# Patient Record
Sex: Female | Born: 1950 | Race: White | Hispanic: No | Marital: Married | Smoking: Never smoker
Health system: Southern US, Community
[De-identification: ages and names within clinical notes are randomized; demographics above are authoritative.]

## PROBLEM LIST (undated history)

## (undated) DIAGNOSIS — E039 Hypothyroidism, unspecified: Secondary | ICD-10-CM

## (undated) DIAGNOSIS — I10 Essential (primary) hypertension: Secondary | ICD-10-CM

## (undated) DIAGNOSIS — M810 Age-related osteoporosis without current pathological fracture: Secondary | ICD-10-CM

---

## 2005-05-30 ENCOUNTER — Other Ambulatory Visit: Admission: RE | Admit: 2005-05-30 | Discharge: 2005-05-30 | Payer: Self-pay | Admitting: Gynecology

## 2006-06-05 ENCOUNTER — Ambulatory Visit (HOSPITAL_COMMUNITY): Admission: RE | Admit: 2006-06-05 | Discharge: 2006-06-05 | Payer: Self-pay | Admitting: General Surgery

## 2006-06-13 ENCOUNTER — Other Ambulatory Visit: Admission: RE | Admit: 2006-06-13 | Discharge: 2006-06-13 | Payer: Self-pay | Admitting: Gynecology

## 2007-08-23 ENCOUNTER — Ambulatory Visit (HOSPITAL_COMMUNITY): Admission: RE | Admit: 2007-08-23 | Discharge: 2007-08-23 | Payer: Self-pay | Admitting: Internal Medicine

## 2007-09-06 ENCOUNTER — Ambulatory Visit: Payer: Self-pay | Admitting: Orthopedic Surgery

## 2007-09-06 DIAGNOSIS — S62319A Displaced fracture of base of unspecified metacarpal bone, initial encounter for closed fracture: Secondary | ICD-10-CM | POA: Insufficient documentation

## 2007-09-29 ENCOUNTER — Encounter: Payer: Self-pay | Admitting: Orthopedic Surgery

## 2010-10-01 NOTE — H&P (Signed)
Rebecca Gilbert, Rebecca Gilbert                ACCOUNT NO.:  [id number]   MEDICAL RECORD NO.:  [id number]          PATIENT TYPE:  AMB   LOCATION:                                FACILITY:  [facility]   PHYSICIAN:  Dalia Heading, M.D.  DATE OF BIRTH:  [birth year]   DATE OF ADMISSION:  06/05/2006  DATE OF DISCHARGE:  LH                              HISTORY & PHYSICAL   CHIEF COMPLAINT:  Need for screening colonoscopy.   HISTORY OF PRESENT ILLNESS:  The patient is a 60 year old white female  who is referred for endoscopic evaluation.  she needs a colonoscopy for  screening purposes.  No abdominal pain, weight loss, nausea, vomiting,  diarrhea, constipation, melena, or hematochezia have been noted.  She  has never had a colonoscopy.  There is no family history of colon  carcinoma.   PAST MEDICAL HISTORY:  Remarkable for hypothyroidism.   PAST SURGICAL HISTORY:  Unremarkable.   CURRENT MEDICATIONS:  Synthroid, Vivelle-Dot, Prometrium.   ALLERGIES:  NO KNOWN DRUG ALLERGIES.   REVIEW OF SYSTEMS:  Noncontributory.   PHYSICAL EXAMINATION:  GENERAL:  The patient is a well-developed, well-  nourished white female in no acute distress.  LUNGS:  Clear to auscultation with equal breath sounds bilaterally.  HEART:  Examination reveals regular rate and rhythm without S3, S4, or  murmurs.  ABDOMEN:  Soft, nontender, and nondistended.  No hepatosplenomegaly or  masses are noted.  RECTAL:  Examination was deferred to the procedure.   IMPRESSION:  Need for screening colonoscopy.   PLAN:  The patient is scheduled for colonoscopy on June 05, 2006.  The risks and benefits of the procedure including bleeding and  perforation were fully explained to the patient, who gave informed  consent.      Dalia Heading, M.D.  Electronically Signed     MAJ/MEDQ  D:  05/04/2006  T:  05/05/2006  Job:  [id number]   cc:   Patrica Duel, M.D.  Fax: [phone]

## 2019-06-27 ENCOUNTER — Other Ambulatory Visit (HOSPITAL_COMMUNITY): Payer: Self-pay | Admitting: Family Medicine

## 2019-06-27 DIAGNOSIS — M810 Age-related osteoporosis without current pathological fracture: Secondary | ICD-10-CM

## 2019-06-27 DIAGNOSIS — Z1231 Encounter for screening mammogram for malignant neoplasm of breast: Secondary | ICD-10-CM

## 2019-07-25 ENCOUNTER — Other Ambulatory Visit: Payer: Self-pay

## 2019-07-25 ENCOUNTER — Ambulatory Visit (HOSPITAL_COMMUNITY)
Admission: RE | Admit: 2019-07-25 | Discharge: 2019-07-25 | Disposition: A | Discharge status: Home / Self Care | Payer: Medicare PPO | Source: Ambulatory Visit | Attending: Family Medicine | Admitting: Family Medicine

## 2019-07-25 DIAGNOSIS — M810 Age-related osteoporosis without current pathological fracture: Secondary | ICD-10-CM | POA: Insufficient documentation

## 2019-07-25 DIAGNOSIS — Z1231 Encounter for screening mammogram for malignant neoplasm of breast: Secondary | ICD-10-CM | POA: Insufficient documentation

## 2019-08-21 ENCOUNTER — Ambulatory Visit (HOSPITAL_COMMUNITY)
Admission: RE | Admit: 2019-08-21 | Discharge: 2019-08-21 | Disposition: A | Discharge status: Home / Self Care | Payer: Medicare PPO | Source: Ambulatory Visit | Attending: Family Medicine | Admitting: Family Medicine

## 2019-08-21 ENCOUNTER — Other Ambulatory Visit: Payer: Self-pay

## 2019-08-21 ENCOUNTER — Ambulatory Visit (HOSPITAL_COMMUNITY): Payer: Medicare PPO

## 2019-08-21 ENCOUNTER — Encounter (HOSPITAL_COMMUNITY): Payer: Self-pay

## 2019-08-21 DIAGNOSIS — Z1231 Encounter for screening mammogram for malignant neoplasm of breast: Secondary | ICD-10-CM | POA: Insufficient documentation

## 2019-08-26 ENCOUNTER — Other Ambulatory Visit (HOSPITAL_COMMUNITY): Payer: Self-pay | Admitting: Family Medicine

## 2019-08-26 ENCOUNTER — Inpatient Hospital Stay
Admission: RE | Admit: 2019-08-26 | Discharge: 2019-08-26 | Disposition: A | Discharge status: Home / Self Care | Payer: Self-pay | Source: Ambulatory Visit | Attending: Family Medicine | Admitting: Family Medicine

## 2019-08-26 DIAGNOSIS — Z1231 Encounter for screening mammogram for malignant neoplasm of breast: Secondary | ICD-10-CM

## 2020-08-04 ENCOUNTER — Other Ambulatory Visit (HOSPITAL_COMMUNITY): Payer: Self-pay | Admitting: Family Medicine

## 2020-08-04 DIAGNOSIS — Z1231 Encounter for screening mammogram for malignant neoplasm of breast: Secondary | ICD-10-CM

## 2020-08-26 ENCOUNTER — Ambulatory Visit (HOSPITAL_COMMUNITY)
Admission: RE | Admit: 2020-08-26 | Discharge: 2020-08-26 | Disposition: A | Discharge status: Home / Self Care | Payer: Medicare PPO | Source: Ambulatory Visit | Attending: Family Medicine | Admitting: Family Medicine

## 2020-08-26 DIAGNOSIS — Z1231 Encounter for screening mammogram for malignant neoplasm of breast: Secondary | ICD-10-CM | POA: Diagnosis present

## 2020-08-27 NOTE — Patient Instructions (Signed)
Rebecca Gilbert [facility]  08/27/2020     @PREFPERIOPPHARMACY @   Your procedure is scheduled on  09/07/2020.   Report to 09/09/2020 at  0700  A.M.   Call this number if you have problems the morning of surgery:  [phone]   Remember:  Do not eat or drink after midnight.                       Take these medicines the morning of surgery with A SIP OF WATER  Levothyroxine.      Please brush your teeth.  Do not wear jewelry, make-up or nail polish.  Do not wear lotions, powders, or perfumes, or deodorant.  Do not shave 48 hours prior to surgery.  Men may shave face and neck.  Do not bring valuables to the hospital.  [facility] is not responsible for any belongings or valuables.  Contacts, dentures or bridgework may not be worn into surgery.  Leave your suitcase in the car.  After surgery it may be brought to your room.  For patients admitted to the hospital, discharge time will be determined by your treatment team.  Patients discharged the day of surgery will not be allowed to drive home and must have someone with them for 24 hours.    Special instructions:  DO NOT smoke tobacco or vape for 24 hours before your surgery.   Please read over the following fact sheets that you were given. Anesthesia Post-op Instructions and Care and Recovery After Surgery      Cataract Surgery, Care After This sheet gives you information about how to care for yourself after your procedure. Your health care provider may also give you more specific instructions. If you have problems or questions, contact your health care provider. What can I expect after the procedure? After the procedure, it is common to have:  Itching.  Discomfort.  Fluid discharge.  Sensitivity to light and to touch.  Bruising in or around the eye.  Mild blurred vision. Follow these instructions at home: Eye care  Do not touch or rub your eyes.  Protect your eyes as told by your health care provider. You  may be told to wear a protective eye shield or sunglasses.  Do not put a contact lens into the affected eye or eyes until your health care provider approves.  Keep the area around your eye clean and dry: ? Avoid swimming. ? Do not allow water to hit you directly in the face while showering. ? Keep soap and shampoo out of your eyes.  Check your eye every day for signs of infection. Watch for: ? Redness, swelling, or pain. ? Fluid, blood, or pus. ? Warmth. ? A bad smell. ? Vision that is getting worse. ? Sensitivity that is getting worse.   Activity  Do not drive for 24 hours if you were given a sedative during your procedure.  Avoid strenuous activities, such as playing contact sports, for as long as told by your health care provider.  Do not drive or use heavy machinery until your health care provider approves.  Do not bend or lift heavy objects. Bending increases pressure in the eye. You can walk, climb stairs, and do light household chores.  Ask your health care provider when you can return to work. If you work in a dusty environment, you may be advised to wear protective eyewear for a period of time. General instructions  Take or  apply over-the-counter and prescription medicines only as told by your health care provider. This includes eye drops.  Keep all follow-up visits as told by your health care provider. This is important. Contact a health care provider if:  You have increased bruising around your eye.  You have pain that is not helped with medicine.  You have a fever.  You have redness, swelling, or pain in your eye.  You have fluid, blood, or pus coming from your incision.  Your vision gets worse.  Your sensitivity to light gets worse. Get help right away if:  You have sudden loss of vision.  You see flashes of light or spots (floaters).  You have severe eye pain.  You develop nausea or vomiting. Summary  After your procedure, it is common to have  itching, discomfort, bruising, fluid discharge, or sensitivity to light.  Follow instructions from your health care provider about caring for your eye after the procedure.  Do not rub your eye after the procedure. You may need to wear eye protection or sunglasses. Do not wear contact lenses. Keep the area around your eye clean and dry.  Avoid activities that require a lot of effort. These include playing sports and lifting heavy objects.  Contact a health care provider if you have increased bruising, pain that does not go away, or a fever. Get help right away if you suddenly lose your vision, see flashes of light or spots, or have severe pain in the eye. This information is not intended to replace advice given to you by your health care provider. Make sure you discuss any questions you have with your health care provider. Document Revised: 02/26/2019 Document Reviewed: 10/30/2017 Elsevier Patient Education  2021 [employer].

## 2020-09-01 NOTE — H&P (Signed)
Surgical History & Physical  Patient Name: Rebecca Gilbert DOB: Feb 08, 1951  Surgery: Cataract extraction with intraocular lens implant phacoemulsification; Right Eye  Surgeon: [name] MD Surgery Date:  09/07/2020 Pre-Op Date:  09/01/2020  HPI: A 48 Yr. old female patient is referred by Dr Rebecca Gilbert for cataract eval. 1. 1. The patient complains of nighttime light - car headlights, street lamps etc. glare causing poor vision, which began 6 months ago. Both eyes are affected. The episode is gradual. The condition's severity increased since last visit. Symptoms occur when the patient is driving and outside. This is negatively affecting the patient's quality of life. HPI Completed by Dr. [name]  Medical History: Cataracts Strabismus, Reduced BVA, Arthritis High Blood Pressure Thyroid Problems  Review of Systems Negative Allergic/Immunologic Negative Cardiovascular Negative Constitutional Negative Ear, Nose, Mouth & Throat Negative Endocrine Negative Eyes Negative Gastrointestinal Negative Genitourinary Negative Hemotologic/Lymphatic Negative Integumentary Negative Musculoskeletal Negative Neurological Negative Psychiatry Negative Respiratory  Social   Never smoked   Medication Levothyroxine, Losartan, Estradiol, Vaginal cream,   Sx/Procedures  None  Drug Allergies   NKDA  History & Physical: Heent:  Cataract, Right eye NECK: supple without bruits LUNGS: lungs clear to auscultation CV: regular rate and rhythm Abdomen: soft and non-tender  Impression & Plan: Assessment: 1.  NUCLEAR SCLEROSIS AGE RELATED; Both Eyes (H25.13) 2.  BLEPHARITIS; Right Lower Lid, Left Upper Lid, Left Lower Lid, Right Upper Lid (H01.001, H01.002,H01.004,H01.005) 3.  CONJUNCTIVOCHALASIS; Both Eyes (H11.823) 4.  DERMATOCHALASIS; Right Upper Lid, Left Upper Lid (H02.831, H02.834) 5.  ASTIGMATISM, REGULAR; Both Eyes (H52.223) 6.  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  Cataract accounts for the patient's decreased vision. This visual impairment is not correctable with a tolerable change in glasses or contact lenses. Cataract surgery with an implantation of a new lens should significantly improve the visual and functional status of the patient. Discussed all risks, benefits, alternatives, and potential complications. Discussed the procedures and recovery. Patient desires to have surgery. A-scan ordered and performed today for intra-ocular lens calculations. The surgery will be performed in order to improve vision for driving, reading, and for eye examinations. Recommend phacoemulsification with intra-ocular lens. Recommend Dextenza for post-operative pain and inflammation. Right Eye worse - first. Dilates well - shugarcaine by protocol. 2.  regular lid cleaning. 3.  Asymptomatic. 4.  May be symptomatic. 5.  Declines toric lens. 6.  Symptomatic. Discussed monocular cataract surgery - risks outweigh benefits.

## 2020-09-02 ENCOUNTER — Encounter (HOSPITAL_COMMUNITY)
Admission: RE | Admit: 2020-09-02 | Discharge: 2020-09-02 | Disposition: A | Discharge status: Home / Self Care | Payer: Medicare PPO | Source: Ambulatory Visit | Attending: Ophthalmology | Admitting: Ophthalmology

## 2020-09-02 ENCOUNTER — Encounter (HOSPITAL_COMMUNITY): Payer: Self-pay

## 2020-09-02 ENCOUNTER — Other Ambulatory Visit: Payer: Self-pay

## 2020-09-02 HISTORY — DX: Age-related osteoporosis without current pathological fracture: M81.0

## 2020-09-02 HISTORY — DX: Essential (primary) hypertension: I10

## 2020-09-02 HISTORY — DX: Hypothyroidism, unspecified: E03.9

## 2020-09-04 ENCOUNTER — Other Ambulatory Visit (HOSPITAL_COMMUNITY)
Admission: RE | Admit: 2020-09-04 | Discharge: 2020-09-04 | Disposition: A | Discharge status: Home / Self Care | Payer: Medicare PPO | Source: Ambulatory Visit | Attending: Ophthalmology | Admitting: Ophthalmology

## 2020-09-04 ENCOUNTER — Other Ambulatory Visit: Payer: Self-pay

## 2020-09-04 DIAGNOSIS — Z01812 Encounter for preprocedural laboratory examination: Secondary | ICD-10-CM | POA: Diagnosis present

## 2020-09-04 DIAGNOSIS — Z20822 Contact with and (suspected) exposure to covid-19: Secondary | ICD-10-CM | POA: Diagnosis not present

## 2020-09-05 LAB — SARS CORONAVIRUS 2 (TAT 6-24 HRS): SARS Coronavirus 2: NEGATIVE

## 2020-09-07 ENCOUNTER — Ambulatory Visit (HOSPITAL_COMMUNITY): Payer: Medicare PPO | Admitting: Anesthesiology

## 2020-09-07 ENCOUNTER — Encounter (HOSPITAL_COMMUNITY): Payer: Self-pay | Admitting: Ophthalmology

## 2020-09-07 ENCOUNTER — Encounter (HOSPITAL_COMMUNITY)
Admission: RE | Disposition: A | Discharge status: Home / Self Care | Payer: Self-pay | Source: Home / Self Care | Attending: Ophthalmology

## 2020-09-07 ENCOUNTER — Ambulatory Visit (HOSPITAL_COMMUNITY)
Admission: RE | Admit: 2020-09-07 | Discharge: 2020-09-07 | Disposition: A | Discharge status: Home / Self Care | Payer: Medicare PPO | Attending: Ophthalmology | Admitting: Ophthalmology

## 2020-09-07 DIAGNOSIS — H52223 Regular astigmatism, bilateral: Secondary | ICD-10-CM | POA: Insufficient documentation

## 2020-09-07 DIAGNOSIS — H0100B Unspecified blepharitis left eye, upper and lower eyelids: Secondary | ICD-10-CM | POA: Diagnosis not present

## 2020-09-07 DIAGNOSIS — H2511 Age-related nuclear cataract, right eye: Secondary | ICD-10-CM | POA: Insufficient documentation

## 2020-09-07 DIAGNOSIS — H11823 Conjunctivochalasis, bilateral: Secondary | ICD-10-CM | POA: Insufficient documentation

## 2020-09-07 DIAGNOSIS — H0100A Unspecified blepharitis right eye, upper and lower eyelids: Secondary | ICD-10-CM | POA: Diagnosis not present

## 2020-09-07 DIAGNOSIS — H50012 Monocular esotropia, left eye: Secondary | ICD-10-CM | POA: Insufficient documentation

## 2020-09-07 DIAGNOSIS — H02831 Dermatochalasis of right upper eyelid: Secondary | ICD-10-CM | POA: Insufficient documentation

## 2020-09-07 DIAGNOSIS — H02834 Dermatochalasis of left upper eyelid: Secondary | ICD-10-CM | POA: Diagnosis not present

## 2020-09-07 HISTORY — PX: CATARACT EXTRACTION W/PHACO: SHX586

## 2020-09-07 SURGERY — PHACOEMULSIFICATION, CATARACT, WITH IOL INSERTION
Anesthesia: Monitor Anesthesia Care | Site: Eye | Laterality: Right

## 2020-09-07 MED ORDER — LIDOCAINE HCL 3.5 % OP GEL
1.0000 "application " | Freq: Once | OPHTHALMIC | Status: AC
  Administered 2020-09-07: 1 via OPHTHALMIC

## 2020-09-07 MED ORDER — EPINEPHRINE PF 1 MG/ML IJ SOLN
INTRAMUSCULAR | Status: AC
  Filled 2020-09-07: qty 2

## 2020-09-07 MED ORDER — TETRACAINE HCL 0.5 % OP SOLN
1.0000 [drp] | OPHTHALMIC | Status: AC | PRN
  Administered 2020-09-07 (×3): 1 [drp] via OPHTHALMIC

## 2020-09-07 MED ORDER — PHENYLEPHRINE HCL 2.5 % OP SOLN
1.0000 [drp] | OPHTHALMIC | Status: AC | PRN
  Administered 2020-09-07 (×3): 1 [drp] via OPHTHALMIC

## 2020-09-07 MED ORDER — SODIUM HYALURONATE 23MG/ML IO SOSY
PREFILLED_SYRINGE | INTRAOCULAR | Status: DC | PRN
  Administered 2020-09-07: 0.6 mL via INTRAOCULAR

## 2020-09-07 MED ORDER — TROPICAMIDE 1 % OP SOLN
1.0000 [drp] | OPHTHALMIC | Status: AC
  Administered 2020-09-07 (×3): 1 [drp] via OPHTHALMIC

## 2020-09-07 MED ORDER — STERILE WATER FOR IRRIGATION IR SOLN
Status: DC | PRN
  Administered 2020-09-07: 250 mL

## 2020-09-07 MED ORDER — POVIDONE-IODINE 5 % OP SOLN
OPHTHALMIC | Status: DC | PRN
  Administered 2020-09-07: 1 via OPHTHALMIC

## 2020-09-07 MED ORDER — LIDOCAINE HCL (PF) 1 % IJ SOLN
INTRAOCULAR | Status: DC | PRN
  Administered 2020-09-07: 1 mL via OPHTHALMIC

## 2020-09-07 MED ORDER — NEOMYCIN-POLYMYXIN-DEXAMETH 3.5-10000-0.1 OP SUSP
OPHTHALMIC | Status: DC | PRN
  Administered 2020-09-07: 1 [drp] via OPHTHALMIC

## 2020-09-07 MED ORDER — EPINEPHRINE PF 1 MG/ML IJ SOLN
INTRAOCULAR | Status: DC | PRN
  Administered 2020-09-07: 500 mL

## 2020-09-07 MED ORDER — SODIUM HYALURONATE 10 MG/ML IO SOLUTION
PREFILLED_SYRINGE | INTRAOCULAR | Status: DC | PRN
  Administered 2020-09-07: 0.85 mL via INTRAOCULAR

## 2020-09-07 MED ORDER — BSS IO SOLN
INTRAOCULAR | Status: DC | PRN
  Administered 2020-09-07: 15 mL

## 2020-09-07 SURGICAL SUPPLY — 11 items
CLOTH BEACON ORANGE TIMEOUT ST (SAFETY) ×2 IMPLANT
EYE SHIELD UNIVERSAL CLEAR (GAUZE/BANDAGES/DRESSINGS) ×2 IMPLANT
GLOVE SURG UNDER POLY LF SZ7 (GLOVE) ×4 IMPLANT
NEEDLE HYPO 18GX1.5 BLUNT FILL (NEEDLE) ×2 IMPLANT
PAD ARMBOARD 7.5X6 YLW CONV (MISCELLANEOUS) ×2 IMPLANT
SYR TB 1ML LL NO SAFETY (SYRINGE) ×2 IMPLANT
TAPE SURG TRANSPORE 1 IN (GAUZE/BANDAGES/DRESSINGS) ×1 IMPLANT
TAPE SURGICAL TRANSPORE 1 IN (GAUZE/BANDAGES/DRESSINGS) ×2
TECNIS 1 PIECE IOL (Intraocular Lens) ×2 IMPLANT
VISCOELASTIC ADDITIONAL (OPHTHALMIC RELATED) IMPLANT
WATER STERILE IRR 250ML POUR (IV SOLUTION) ×2 IMPLANT

## 2020-09-07 NOTE — Anesthesia Preprocedure Evaluation (Signed)
Anesthesia Evaluation  Patient identified by MRN, date of birth, ID band Patient awake    Reviewed: Allergy & Precautions, NPO status , Patient's Chart, lab work & pertinent test results  Airway Mallampati: II  TM Distance: >3 FB Neck ROM: Full    Dental  (+) Dental Advisory Given, Teeth Intact   Pulmonary neg pulmonary ROS,    Pulmonary exam normal breath sounds clear to auscultation       Cardiovascular Exercise Tolerance: Good hypertension, Pt. on medications Normal cardiovascular exam Rhythm:Regular Rate:Normal     Neuro/Psych negative neurological ROS  negative psych ROS   GI/Hepatic negative GI ROS, Neg liver ROS,   Endo/Other  Hypothyroidism   Renal/GU negative Renal ROS  negative genitourinary   Musculoskeletal Osteoporosis     Abdominal   Peds negative pediatric ROS (+)  Hematology negative hematology ROS (+)   Anesthesia Other Findings   Reproductive/Obstetrics negative OB ROS                             Anesthesia Physical  Anesthesia Plan  ASA: II  Anesthesia Plan: MAC   Post-op Pain Management:    Induction:   PONV Risk Score and Plan: Treatment may vary due to age or medical condition  Airway Management Planned: Nasal Cannula and Natural Airway  Additional Equipment:   Intra-op Plan:   Post-operative Plan:   Informed Consent: I have reviewed the patients History and Physical, chart, labs and discussed the procedure including the risks, benefits and alternatives for the proposed anesthesia with the patient or authorized representative who has indicated his/her understanding and acceptance.     Dental advisory given  Plan Discussed with: CRNA and Surgeon  Anesthesia Plan Comments:         Anesthesia Quick Evaluation  

## 2020-09-07 NOTE — Op Note (Signed)
Date of procedure: 09/07/20  Pre-operative diagnosis: Visually significant age-related nuclear cataract, Right Eye (H25.11)  Post-operative diagnosis: Visually significant age-related nuclear cataract, Right Eye  Procedure: Removal of cataract via phacoemulsification and insertion of intra-ocular lens Wynetta Emery and [employer] DCB00  +20.0D into the capsular bag of the Right Eye  Attending surgeon: Gerda Diss. Prim Morace, MD, MA  Anesthesia: MAC, Topical Akten  Complications: None  Estimated Blood Loss: <38m (minimal)  Specimens: None  Implants: As above  Indications:  Visually significant age-related cataract, Right Eye  Procedure:  The patient was seen and identified in the pre-operative area. The operative eye was identified and dilated.  The operative eye was marked.  Topical anesthesia was administered to the operative eye.     The patient was then to the operative suite and placed in the supine position.  A timeout was performed confirming the patient, procedure to be performed, and all other relevant information.   The patient's face was prepped and draped in the usual fashion for intra-ocular surgery.  A lid speculum was placed into the operative eye and the surgical microscope moved into place and focused.  A superotemporal paracentesis was created using a 20 gauge paracentesis blade.  Shugarcaine was injected into the anterior chamber.  Viscoelastic was injected into the anterior chamber.  A temporal clear-corneal main wound incision was created using a 2.449mmicrokeratome.  A continuous curvilinear capsulorrhexis was initiated using an irrigating cystitome and completed using capsulorrhexis forceps.  Hydrodissection and hydrodeliniation were performed.  Viscoelastic was injected into the anterior chamber.  A phacoemulsification handpiece and a chopper as a second instrument were used to remove the nucleus and epinucleus. The irrigation/aspiration handpiece was used to remove any  remaining cortical material.   The capsular bag was reinflated with viscoelastic, checked, and found to be intact.  The intraocular lens was inserted into the capsular bag.  The irrigation/aspiration handpiece was used to remove any remaining viscoelastic.  The clear corneal wound and paracentesis wounds were then hydrated and checked with Weck-Cels to be watertight.  The lid-speculum and drape was removed, and the patient's face was cleaned with a wet and dry 4x4.  Maxitrol was instilled in the eye before a clear shield was taped over the eye. The patient was taken to the post-operative care unit in good condition, having tolerated the procedure well.  Post-Op Instructions: [facility] and will receive all other orders and instructions.

## 2020-09-07 NOTE — Interval H&P Note (Signed)
History and Physical Interval Note:  09/07/2020 8:11 AM  Rebecca Gilbert  has presented today for surgery, with the diagnosis of Nuclear sclerotic cataract - Right eye.  The various methods of treatment have been discussed with the patient and family. After consideration of risks, benefits and other options for treatment, the patient has consented to  Procedure(s) with comments: CATARACT EXTRACTION PHACO AND INTRAOCULAR LENS PLACEMENT RIGHT EYE (Right) - right as a surgical intervention.  The patient's history has been reviewed, patient examined, no change in status, stable for surgery.  I have reviewed the patient's chart and labs.  Questions were answered to the patient's satisfaction.     [name]

## 2020-09-07 NOTE — Discharge Instructions (Addendum)
Please discharge patient when stable, will follow up today with Dr. Wrzosek at the [facility] office immediately following discharge.  Leave shield in place until visit.  All paperwork with discharge instructions will be given at the office.   [facility] Address:  [address]  [place], [place] [address]  

## 2020-09-07 NOTE — Transfer of Care (Signed)
Immediate Anesthesia Transfer of Care Note  Patient: Rebecca Gilbert [facility]  Procedure(s) Performed: CATARACT EXTRACTION PHACO AND INTRAOCULAR LENS PLACEMENT RIGHT EYE (Right Eye)  Patient Location: Short Stay  Anesthesia Type:MAC  Level of Consciousness: awake  Airway & Oxygen Therapy: Patient Spontanous Breathing  Post-op Assessment: Report given to RN  Post vital signs: Reviewed and stable  Last Vitals:  Vitals Value Taken Time  BP    Temp    Pulse    Resp    SpO2      Last Pain:  Vitals:   09/07/20 0705  TempSrc: Oral  PainSc: 0-No pain      Patients Stated Pain Goal: 6 (56/81/27 5170)  Complications: No complications documented.

## 2020-09-07 NOTE — Anesthesia Postprocedure Evaluation (Signed)
Anesthesia Post Note  Patient: Rebecca Gilbert [facility]  Procedure(s) Performed: CATARACT EXTRACTION PHACO AND INTRAOCULAR LENS PLACEMENT RIGHT EYE (Right Eye)  Patient location during evaluation: Short Stay Anesthesia Type: MAC Level of consciousness: awake and alert Pain management: pain level controlled Vital Signs Assessment: post-procedure vital signs reviewed and stable Respiratory status: spontaneous breathing Cardiovascular status: blood pressure returned to baseline and stable Postop Assessment: no apparent nausea or vomiting Anesthetic complications: no   No complications documented.   Last Vitals:  Vitals:   09/07/20 0705  BP: 125/75  Pulse: 74  Resp: 18  Temp: 36.7 C  SpO2: 100%    Last Pain:  Vitals:   09/07/20 0705  TempSrc: Oral  PainSc: 0-No pain                 Nancy Arvin

## 2020-09-08 ENCOUNTER — Encounter (HOSPITAL_COMMUNITY)
Admission: RE | Admit: 2020-09-08 | Payer: Medicare PPO | Source: Ambulatory Visit | Attending: Internal Medicine | Admitting: Internal Medicine

## 2020-09-09 ENCOUNTER — Encounter (HOSPITAL_COMMUNITY): Payer: Self-pay | Admitting: Ophthalmology

## 2020-09-15 ENCOUNTER — Encounter (HOSPITAL_COMMUNITY): Payer: Medicare PPO

## 2020-09-15 NOTE — H&P (Signed)
Surgical History & Physical  Patient Name: Rebecca Gilbert DOB: [birth year]  Surgery: Cataract extraction with intraocular lens implant phacoemulsification; Left Eye  Surgeon: [name] MD Surgery Date:  09/21/2020 Pre-Op Date:  09/14/2020  HPI: A 61 Yr. old female patient 1. The patient complains of nighttime light - car headlights, street lamps etc. glare causing poor vision, which began 6 months ago. The left eye is affected. The episode is gradual. The condition's severity increased since last visit. Symptoms occur when the patient is driving and outside. This is negatively affecting her quality of life. 2. The patient is returning after cataract post-op. The right eye is affected. Status post cataract post-op, which began 1 week ago: Since the last visit, the affected area feels improvement. The patient's vision is improved. Patient is following medication instructions. HPI Completed by Dr. [name]  Medical History: Cataracts Strabismus, Reduced BVA, Arthritis High Blood Pressure Thyroid Problems  Review of Systems Negative Allergic/Immunologic Negative Cardiovascular Negative Constitutional Negative Ear, Nose, Mouth & Throat Negative Endocrine Negative Eyes Negative Gastrointestinal Negative Genitourinary Negative Hemotologic/Lymphatic Negative Integumentary Negative Musculoskeletal Negative Neurological Negative Psychiatry Negative Respiratory  Social   Never smoked   Medication Prednisolone-gatiflox-bromfenac,  Levothyroxine, Losartan, Estradiol, Vaginal cream,   Sx/Procedures Phaco c IOL OD,   Drug Allergies   NKDA  History & Physical: Heent:  Cataract, Left eye NECK: supple without bruits LUNGS: lungs clear to auscultation CV: regular rate and rhythm Abdomen: soft and non-tender  Impression & Plan: Assessment: 1.  CATARACT EXTRACTION STATUS; Right Eye (Z98.41) 2.  NUCLEAR SCLEROSIS AGE RELATED; , Left Eye (H25.12)  Plan: 1.  1 week after  cataract surgery. Doing well with improved vision and normal eye pressure. Call with any problems or concerns. Continue Pred-Moxi-Brom 2x/day for 3 more weeks.  2.  Dilates well - shugarcaine by protocol. Cataract accounts for the patient's decreased vision. This visual impairment is not correctable with a tolerable change in glasses or contact lenses. Cataract surgery with an implantation of a new lens should significantly improve the visual and functional status of the patient. Discussed all risks, benefits, alternatives, and potential complications. Discussed the procedures and recovery. Patient desires to have surgery. A-scan ordered and performed today for intra-ocular lens calculations. The surgery will be performed in order to improve vision for driving, reading, and for eye examinations. Recommend phacoemulsification with intra-ocular lens. Recommend Dextenza for post-operative pain and inflammation. Left Eye. Surgery required to correct imbalance of vision.

## 2020-09-17 ENCOUNTER — Other Ambulatory Visit: Payer: Self-pay

## 2020-09-17 ENCOUNTER — Encounter (HOSPITAL_COMMUNITY)
Admit: 2020-09-17 | Discharge: 2020-09-17 | Disposition: A | Discharge status: Home / Self Care | Payer: Medicare PPO | Attending: Ophthalmology | Admitting: Ophthalmology

## 2020-09-18 ENCOUNTER — Other Ambulatory Visit (HOSPITAL_COMMUNITY)
Admission: RE | Admit: 2020-09-18 | Discharge: 2020-09-18 | Disposition: A | Discharge status: Home / Self Care | Payer: Medicare PPO | Source: Ambulatory Visit | Attending: Ophthalmology | Admitting: Ophthalmology

## 2020-09-18 ENCOUNTER — Other Ambulatory Visit: Payer: Self-pay

## 2020-09-18 DIAGNOSIS — Z20822 Contact with and (suspected) exposure to covid-19: Secondary | ICD-10-CM | POA: Diagnosis not present

## 2020-09-18 DIAGNOSIS — Z01812 Encounter for preprocedural laboratory examination: Secondary | ICD-10-CM | POA: Diagnosis present

## 2020-09-19 LAB — SARS CORONAVIRUS 2 (TAT 6-24 HRS): SARS Coronavirus 2: NEGATIVE

## 2020-09-21 ENCOUNTER — Ambulatory Visit (HOSPITAL_COMMUNITY): Payer: Medicare PPO | Admitting: Anesthesiology

## 2020-09-21 ENCOUNTER — Encounter (HOSPITAL_COMMUNITY): Payer: Self-pay | Admitting: Ophthalmology

## 2020-09-21 ENCOUNTER — Encounter (HOSPITAL_COMMUNITY)
Admission: RE | Disposition: A | Discharge status: Home / Self Care | Payer: Self-pay | Source: Home / Self Care | Attending: Ophthalmology

## 2020-09-21 ENCOUNTER — Ambulatory Visit (HOSPITAL_COMMUNITY)
Admission: RE | Admit: 2020-09-21 | Discharge: 2020-09-21 | Disposition: A | Discharge status: Home / Self Care | Payer: Medicare PPO | Attending: Ophthalmology | Admitting: Ophthalmology

## 2020-09-21 DIAGNOSIS — Z9841 Cataract extraction status, right eye: Secondary | ICD-10-CM | POA: Insufficient documentation

## 2020-09-21 DIAGNOSIS — H2512 Age-related nuclear cataract, left eye: Secondary | ICD-10-CM | POA: Insufficient documentation

## 2020-09-21 DIAGNOSIS — Z961 Presence of intraocular lens: Secondary | ICD-10-CM | POA: Insufficient documentation

## 2020-09-21 DIAGNOSIS — Z7952 Long term (current) use of systemic steroids: Secondary | ICD-10-CM | POA: Insufficient documentation

## 2020-09-21 DIAGNOSIS — Z79890 Hormone replacement therapy (postmenopausal): Secondary | ICD-10-CM | POA: Diagnosis not present

## 2020-09-21 DIAGNOSIS — Z79899 Other long term (current) drug therapy: Secondary | ICD-10-CM | POA: Insufficient documentation

## 2020-09-21 HISTORY — PX: CATARACT EXTRACTION W/PHACO: SHX586

## 2020-09-21 SURGERY — PHACOEMULSIFICATION, CATARACT, WITH IOL INSERTION
Anesthesia: Monitor Anesthesia Care | Site: Eye | Laterality: Left

## 2020-09-21 MED ORDER — TETRACAINE HCL 0.5 % OP SOLN
1.0000 [drp] | OPHTHALMIC | Status: AC | PRN
  Administered 2020-09-21 (×3): 1 [drp] via OPHTHALMIC

## 2020-09-21 MED ORDER — PHENYLEPHRINE HCL 2.5 % OP SOLN
1.0000 [drp] | OPHTHALMIC | Status: AC | PRN
  Administered 2020-09-21 (×3): 1 [drp] via OPHTHALMIC

## 2020-09-21 MED ORDER — LIDOCAINE HCL (PF) 1 % IJ SOLN
INTRAOCULAR | Status: DC | PRN
  Administered 2020-09-21: 1 mL via OPHTHALMIC

## 2020-09-21 MED ORDER — EPINEPHRINE PF 1 MG/ML IJ SOLN
INTRAMUSCULAR | Status: AC
  Filled 2020-09-21: qty 2

## 2020-09-21 MED ORDER — LIDOCAINE HCL 3.5 % OP GEL
1.0000 "application " | Freq: Once | OPHTHALMIC | Status: AC
  Administered 2020-09-21: 1 via OPHTHALMIC

## 2020-09-21 MED ORDER — SODIUM HYALURONATE 23MG/ML IO SOSY
PREFILLED_SYRINGE | INTRAOCULAR | Status: DC | PRN
  Administered 2020-09-21: 0.6 mL via INTRAOCULAR

## 2020-09-21 MED ORDER — NEOMYCIN-POLYMYXIN-DEXAMETH 3.5-10000-0.1 OP SUSP
OPHTHALMIC | Status: DC | PRN
  Administered 2020-09-21: 1 [drp] via OPHTHALMIC

## 2020-09-21 MED ORDER — EPINEPHRINE PF 1 MG/ML IJ SOLN
INTRAOCULAR | Status: DC | PRN
  Administered 2020-09-21: 500 mL

## 2020-09-21 MED ORDER — BSS IO SOLN
INTRAOCULAR | Status: DC | PRN
  Administered 2020-09-21: 15 mL via INTRAOCULAR

## 2020-09-21 MED ORDER — TROPICAMIDE 1 % OP SOLN
1.0000 [drp] | OPHTHALMIC | Status: AC
  Administered 2020-09-21 (×3): 1 [drp] via OPHTHALMIC

## 2020-09-21 MED ORDER — SODIUM HYALURONATE 10 MG/ML IO SOLUTION
PREFILLED_SYRINGE | INTRAOCULAR | Status: DC | PRN
  Administered 2020-09-21: 0.85 mL via INTRAOCULAR

## 2020-09-21 MED ORDER — POVIDONE-IODINE 5 % OP SOLN
OPHTHALMIC | Status: DC | PRN
  Administered 2020-09-21: 1 via OPHTHALMIC

## 2020-09-21 MED ORDER — STERILE WATER FOR IRRIGATION IR SOLN
Status: DC | PRN
  Administered 2020-09-21: 250 mL

## 2020-09-21 SURGICAL SUPPLY — 12 items
CLOTH BEACON ORANGE TIMEOUT ST (SAFETY) ×2 IMPLANT
EYE SHIELD UNIVERSAL CLEAR (GAUZE/BANDAGES/DRESSINGS) ×2 IMPLANT
GLOVE SURG UNDER POLY LF SZ6.5 (GLOVE) ×2 IMPLANT
GLOVE SURG UNDER POLY LF SZ7 (GLOVE) ×2 IMPLANT
NEEDLE HYPO 18GX1.5 BLUNT FILL (NEEDLE) ×2 IMPLANT
PAD ARMBOARD 7.5X6 YLW CONV (MISCELLANEOUS) ×2 IMPLANT
SYR TB 1ML LL NO SAFETY (SYRINGE) ×2 IMPLANT
TAPE SURG TRANSPORE 1 IN (GAUZE/BANDAGES/DRESSINGS) ×1 IMPLANT
TAPE SURGICAL TRANSPORE 1 IN (GAUZE/BANDAGES/DRESSINGS) ×2
TECNIS 1 PIECE IOL (Intraocular Lens) ×2 IMPLANT
VISCOELASTIC ADDITIONAL (OPHTHALMIC RELATED) IMPLANT
WATER STERILE IRR 250ML POUR (IV SOLUTION) ×2 IMPLANT

## 2020-09-21 NOTE — Anesthesia Postprocedure Evaluation (Signed)
Anesthesia Post Note  Patient: Rebecca Gilbert [facility]  Procedure(s) Performed: CATARACT EXTRACTION PHACO AND INTRAOCULAR LENS PLACEMENT LEFT EYE (Left Eye)  Patient location during evaluation: Short Stay Anesthesia Type: MAC Level of consciousness: awake and alert and oriented Pain management: pain level controlled Vital Signs Assessment: post-procedure vital signs reviewed and stable Respiratory status: spontaneous breathing Cardiovascular status: stable Postop Assessment: no apparent nausea or vomiting Anesthetic complications: no   No complications documented.   Last Vitals:  Vitals:   09/21/20 1038 09/21/20 1108  BP: 131/74 132/79  Pulse: 76 73  Resp: 18 16  Temp: 36.6 C 36.6 C  SpO2: 100% 100%    Last Pain:  Vitals:   09/21/20 1108  TempSrc: Oral  PainSc: 0-No pain                 Maiana Hennigan

## 2020-09-21 NOTE — Discharge Instructions (Signed)
Please discharge patient when stable, will follow up today with Dr. Devlin Mcveigh at the [facility] office immediately following discharge.  Leave shield in place until visit.  All paperwork with discharge instructions will be given at the office.  [facility] Address:  [address]  [place], [place] [address]  

## 2020-09-21 NOTE — Op Note (Signed)
Date of procedure: 09/21/20  Pre-operative diagnosis: Visually significant age-related nuclear cataract, Left Eye (H25.12)  Post-operative diagnosis: Visually significant age-related nuclear cataract, Left Eye  Procedure: Removal of cataract via phacoemulsification and insertion of intra-ocular lens [employer]  +21.0D into the capsular bag of the Left Eye  Attending surgeon: Gerda Diss. Cale Bethard, MD, MA  Anesthesia: MAC, Topical Akten  Complications: None  Estimated Blood Loss: <93m (minimal)  Specimens: None  Implants: As above  Indications:  Visually significant age-related cataract, Left Eye  Procedure:  The patient was seen and identified in the pre-operative area. The operative eye was identified and dilated.  The operative eye was marked.  Topical anesthesia was administered to the operative eye.     The patient was then to the operative suite and placed in the supine position.  A timeout was performed confirming the patient, procedure to be performed, and all other relevant information.   The patient's face was prepped and draped in the usual fashion for intra-ocular surgery.  A lid speculum was placed into the operative eye and the surgical microscope moved into place and focused.  An inferotemporal paracentesis was created using a 20 gauge paracentesis blade.  Shugarcaine was injected into the anterior chamber.  Viscoelastic was injected into the anterior chamber.  A temporal clear-corneal main wound incision was created using a 2.423mmicrokeratome.  A continuous curvilinear capsulorrhexis was initiated using an irrigating cystitome and completed using capsulorrhexis forceps.  Hydrodissection and hydrodeliniation were performed.  Viscoelastic was injected into the anterior chamber.  A phacoemulsification handpiece and a chopper as a second instrument were used to remove the nucleus and epinucleus. The irrigation/aspiration handpiece was used to remove any remaining  cortical material.   The capsular bag was reinflated with viscoelastic, checked, and found to be intact.  The intraocular lens was inserted into the capsular bag.  The irrigation/aspiration handpiece was used to remove any remaining viscoelastic.  The clear corneal wound and paracentesis wounds were then hydrated and checked with Weck-Cels to be watertight.  The lid-speculum and drape was removed, and the patient's face was cleaned with a wet and dry 4x4.  Maxitrol was instilled in the eye before a clear shield was taped over the eye. The patient was taken to the post-operative care unit in good condition, having tolerated the procedure well.  Post-Op Instructions: [facility] and will receive all other orders and instructions.

## 2020-09-21 NOTE — Transfer of Care (Signed)
Immediate Anesthesia Transfer of Care Note  Patient: Rebecca Gilbert [facility]  Procedure(s) Performed: CATARACT EXTRACTION PHACO AND INTRAOCULAR LENS PLACEMENT LEFT EYE (Left Eye)  Patient Location: Short Stay  Anesthesia Type:MAC  Level of Consciousness: awake  Airway & Oxygen Therapy: Patient Spontanous Breathing  Post-op Assessment: Report given to RN  Post vital signs: Reviewed and stable  Last Vitals:  Vitals Value Taken Time  BP 132/79 09/21/20 1108  Temp 36.6 C 09/21/20 1108  Pulse 73 09/21/20 1108  Resp 16 09/21/20 1108  SpO2 100 % 09/21/20 1108    Last Pain:  Vitals:   09/21/20 1108  TempSrc: Oral  PainSc: 0-No pain         Complications: No complications documented.

## 2020-09-21 NOTE — Anesthesia Preprocedure Evaluation (Signed)
Anesthesia Evaluation  Patient identified by MRN, date of birth, ID band Patient awake    Reviewed: Allergy & Precautions, NPO status , Patient's Chart, lab work & pertinent test results  Airway Mallampati: II  TM Distance: >3 FB Neck ROM: Full    Dental  (+) Dental Advisory Given, Teeth Intact   Pulmonary neg pulmonary ROS,    Pulmonary exam normal breath sounds clear to auscultation       Cardiovascular Exercise Tolerance: Good hypertension, Pt. on medications Normal cardiovascular exam Rhythm:Regular Rate:Normal     Neuro/Psych negative neurological ROS  negative psych ROS   GI/Hepatic negative GI ROS, Neg liver ROS,   Endo/Other  Hypothyroidism   Renal/GU negative Renal ROS  negative genitourinary   Musculoskeletal Osteoporosis     Abdominal   Peds negative pediatric ROS (+)  Hematology negative hematology ROS (+)   Anesthesia Other Findings   Reproductive/Obstetrics negative OB ROS                             Anesthesia Physical  Anesthesia Plan  ASA: II  Anesthesia Plan: MAC   Post-op Pain Management:    Induction:   PONV Risk Score and Plan: Treatment may vary due to age or medical condition  Airway Management Planned: Nasal Cannula and Natural Airway  Additional Equipment:   Intra-op Plan:   Post-operative Plan:   Informed Consent: I have reviewed the patients History and Physical, chart, labs and discussed the procedure including the risks, benefits and alternatives for the proposed anesthesia with the patient or authorized representative who has indicated his/her understanding and acceptance.     Dental advisory given  Plan Discussed with: CRNA and Surgeon  Anesthesia Plan Comments:         Anesthesia Quick Evaluation

## 2020-09-21 NOTE — Interval H&P Note (Signed)
History and Physical Interval Note:  09/21/2020 10:34 AM  Rebecca Gilbert  has presented today for surgery, with the diagnosis of Nuclear sclerotic cataract - Left eye.  The various methods of treatment have been discussed with the patient and family. After consideration of risks, benefits and other options for treatment, the patient has consented to  Procedure(s) with comments: CATARACT EXTRACTION PHACO AND INTRAOCULAR LENS PLACEMENT LEFT EYE (Left) - left as a surgical intervention.  The patient's history has been reviewed, patient examined, no change in status, stable for surgery.  I have reviewed the patient's chart and labs.  Questions were answered to the patient's satisfaction.     [name]

## 2020-09-22 ENCOUNTER — Encounter (HOSPITAL_COMMUNITY): Payer: Self-pay | Admitting: Ophthalmology

## 2020-10-11 IMAGING — MG DIGITAL SCREENING BILAT W/ TOMO W/ CAD
8 series · 8 of 24 positions shown · non-contrast
Comparison: Previous exam(s).

CLINICAL DATA: Screening.

EXAM:
DIGITAL SCREENING BILATERAL MAMMOGRAM WITH TOMO AND CAD

[R CC synth-2D]
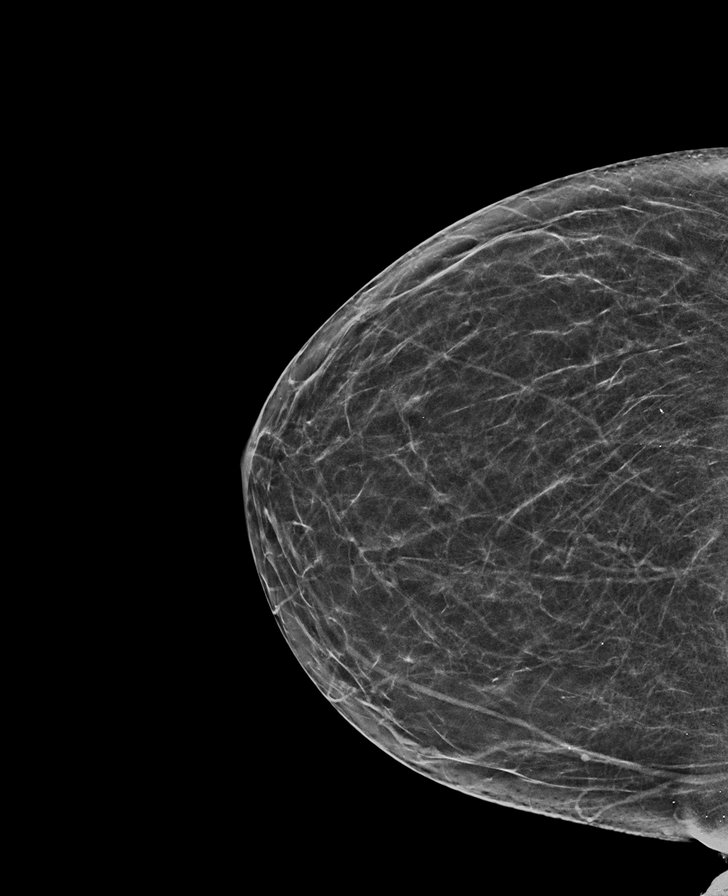

[R MLO synth-2D]
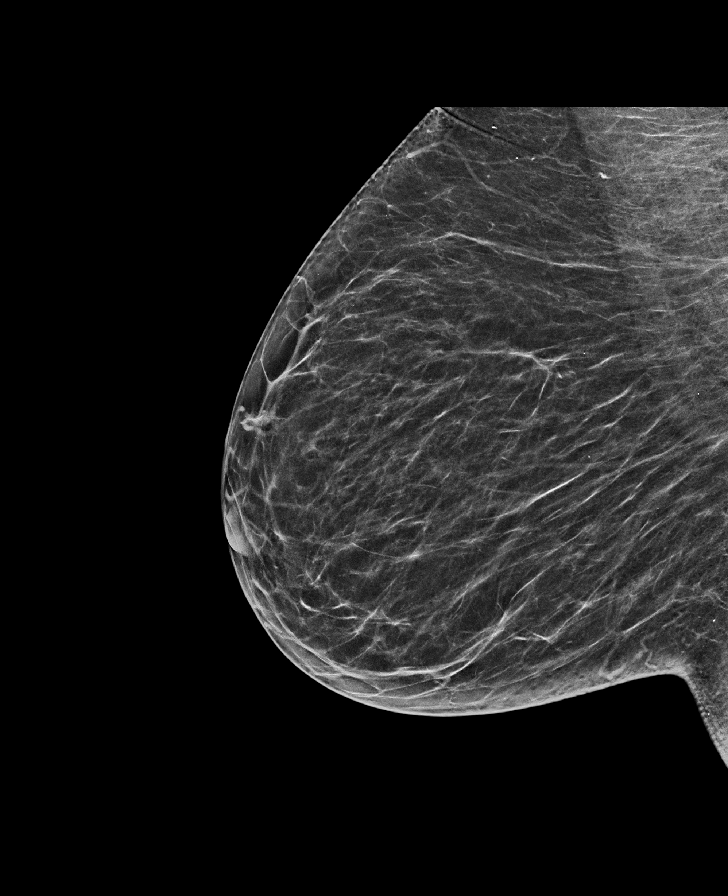

[L CC synth-2D]
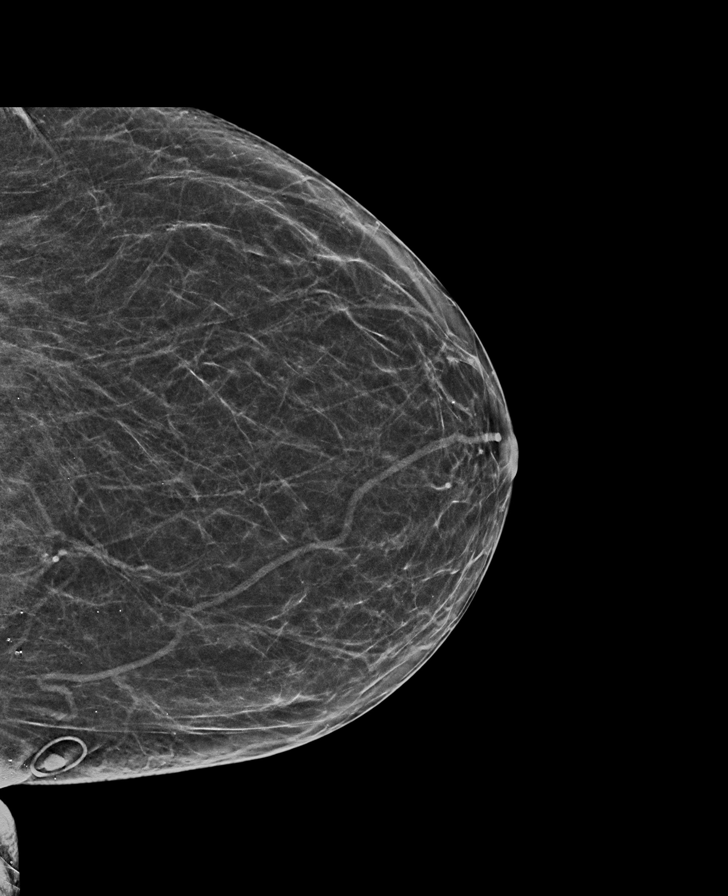

[L MLO synth-2D]
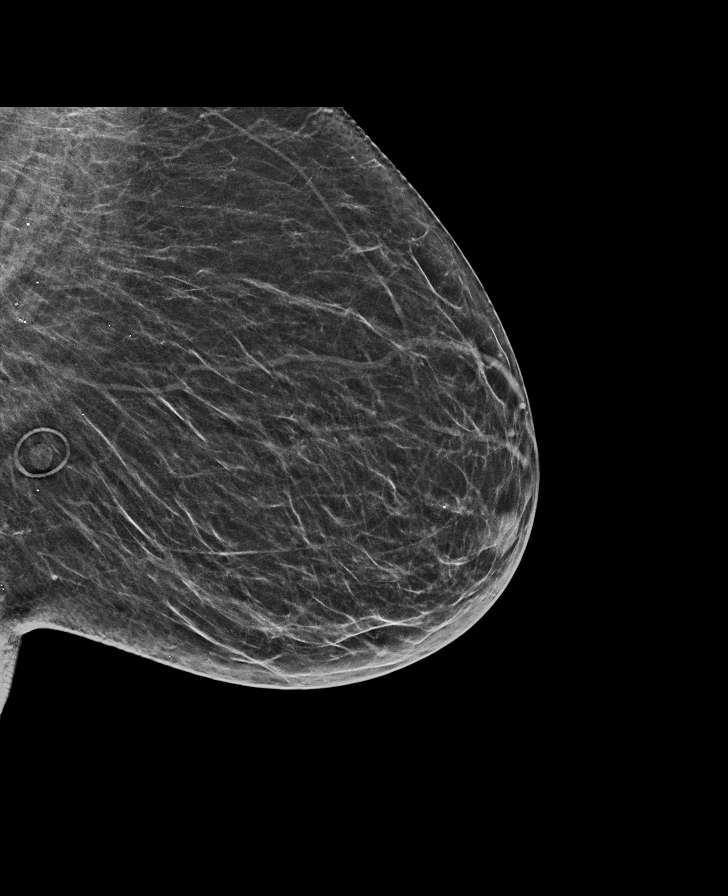

[R MLO tomo · tomo slice 31/62.0]
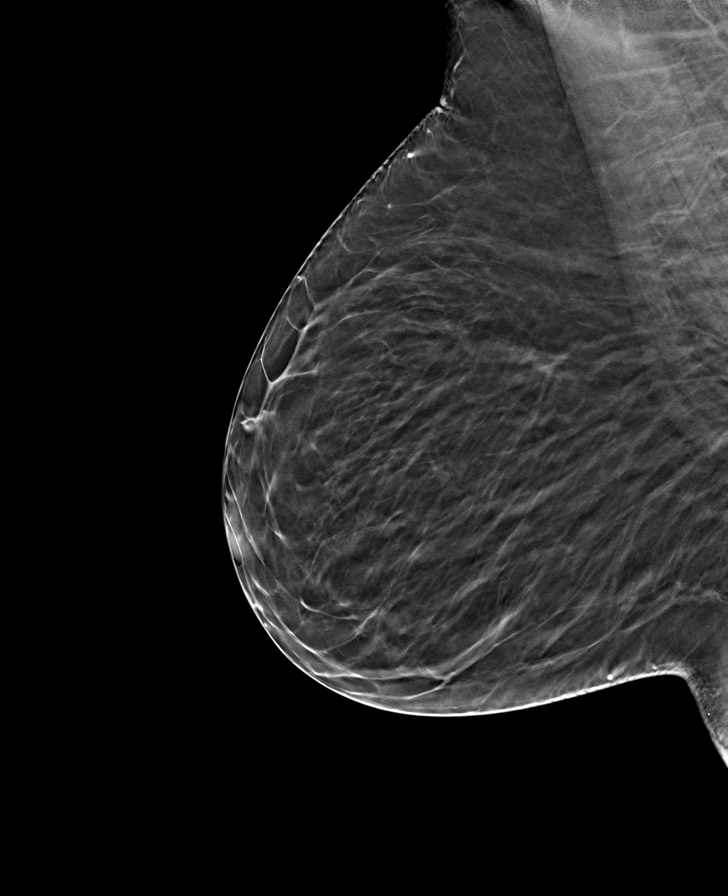

[L MLO tomo · tomo slice 30/59.0]
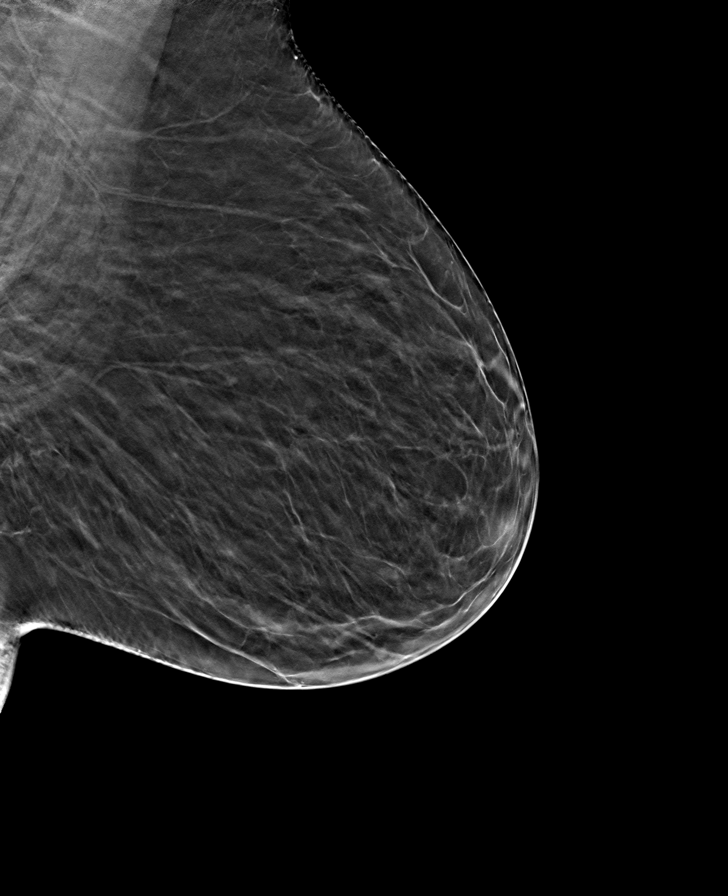

[R CC tomo · tomo slice 29/58.0]
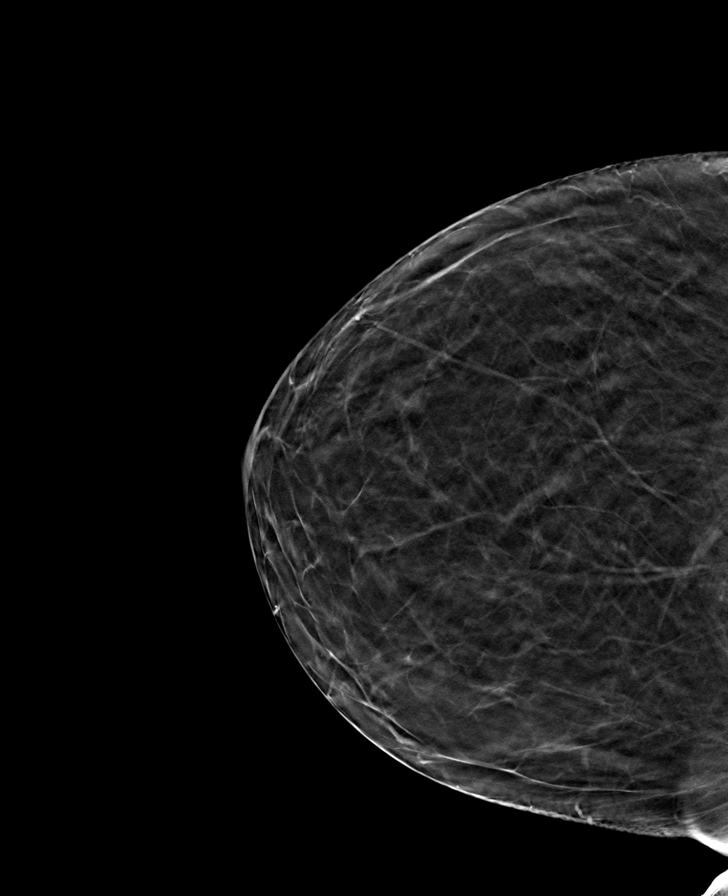

[L CC tomo · tomo slice 30/59.0]
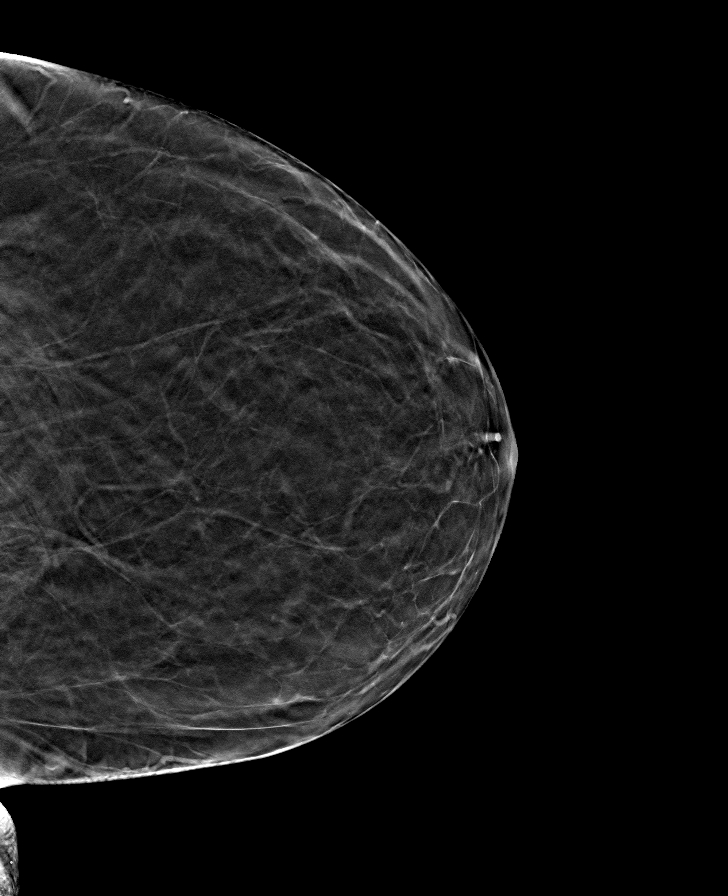

[8 of 24 positions shown; findings below may reference images not displayed]

ACR Breast Density Category b: There are scattered areas of
fibroglandular density.
FINDINGS: There are no findings suspicious for malignancy. Images were
processed with CAD.
IMPRESSION: No mammographic evidence of malignancy. A result letter of this
screening mammogram will be mailed directly to the patient.

RECOMMENDATION:
Screening mammogram in one year. (Code:[id number])

BI-RADS CATEGORY  1: Negative.

## 2021-08-20 ENCOUNTER — Other Ambulatory Visit (HOSPITAL_COMMUNITY): Payer: Self-pay | Admitting: Family Medicine

## 2021-08-20 DIAGNOSIS — Z1231 Encounter for screening mammogram for malignant neoplasm of breast: Secondary | ICD-10-CM

## 2021-08-30 ENCOUNTER — Ambulatory Visit (HOSPITAL_COMMUNITY)
Admission: RE | Admit: 2021-08-30 | Discharge: 2021-08-30 | Disposition: A | Discharge status: Home / Self Care | Payer: Medicare PPO | Source: Ambulatory Visit | Attending: Family Medicine | Admitting: Family Medicine

## 2021-08-30 DIAGNOSIS — Z1231 Encounter for screening mammogram for malignant neoplasm of breast: Secondary | ICD-10-CM | POA: Insufficient documentation

## 2022-08-03 ENCOUNTER — Encounter (HOSPITAL_COMMUNITY): Payer: Self-pay

## 2022-08-03 ENCOUNTER — Other Ambulatory Visit: Payer: Self-pay

## 2022-08-03 ENCOUNTER — Encounter (HOSPITAL_COMMUNITY)
Admission: RE | Admit: 2022-08-03 | Discharge: 2022-08-03 | Disposition: A | Discharge status: Home / Self Care | Payer: Medicare PPO | Source: Ambulatory Visit | Attending: Ophthalmology | Admitting: Ophthalmology

## 2022-08-04 NOTE — H&P (Addendum)
Surgical History & Physical  Patient Name: Rebecca Gilbert       DOB: 11/03/1950  Surgery: Blepharoplasty; Right Upper Lid & Left Upper Lid  Surgeon: Baruch Goldmann MD Surgery Date:  08-08-22 Pre-Op Date:  07-27-22  HPI: A 26 Yr. old female patient present for Bleph eval with VF. Patient states eyelids feel heavy during the afternoons. Has noticed side vision is becoming more difficult to see. When driving she has to turn her head more to see to the sides of her. States its hard to find her best positioning. She is having to move her head around to find her best vision. She started noticing this about 2 years ago. Denies using eye drops.  Medical History: Cataracts Strabismus, Reduced BVA, Arthritis High Blood Pressure Thyroid Problems  Review of Systems Negative Allergic/Immunologic Negative Cardiovascular Negative Constitutional Negative Ear, Nose, Mouth & Throat Negative Endocrine Negative Eyes Negative Gastrointestinal Negative Genitourinary Negative Hemotologic/Lymphatic Negative Integumentary Negative Musculoskeletal Negative Neurological Negative Psychiatry Negative Respiratory  Social   Never smoked   Medication Levothyroxine, Losartan, Estradiol, Vaginal cream, Alendronate, Multivitamin,   Sx/Procedures Phaco c IOL OD, Phaco c IOL OS, Laser - Yag Capsulotomy,   Drug Allergies   NKDA  History & Physical: Heent: dermatochalasis; right upper lid & left upper lid NECK: supple without bruits LUNGS: lungs clear to auscultation CV: regular rate and rhythm Abdomen: soft and non-tender Impression & Plan: Assessment: 1.  DERMATOCHALASIS; Right Upper Lid, Left Upper Lid (H02.831, H02.834) 2.  INTRAOCULAR LENS IOL (Z96.1) 3.  BLEPHARITIS; Right Lower Lid, Left Upper Lid, Left Lower Lid, Right Upper Lid (H01.001, H01.002,H01.004,H01.005) 4.  CONJUNCTIVOCHALASIS; ,, ,, Both Eyes IV:[id number])  Plan: 1.  Symptomatic,. Visual fields shows visually significant  dermatochalasis right and left upper eyelids. Photos obtained and interpreted. Recommend bilateral upper eyelid blepharoplasty Risks, benefits, alternatives, and complications discussed. No aspirin, Aleve, Advil or other blood thinner 10 day prior to surgery.  2.  S/P YAG OU.  3.  regular lid cleaning.  4.  Asymptomatic.

## 2022-08-08 ENCOUNTER — Ambulatory Visit (HOSPITAL_COMMUNITY)
Admission: RE | Admit: 2022-08-08 | Discharge: 2022-08-08 | Disposition: A | Discharge status: Home / Self Care | Payer: Medicare PPO | Attending: Ophthalmology | Admitting: Ophthalmology

## 2022-08-08 ENCOUNTER — Ambulatory Visit (HOSPITAL_COMMUNITY): Payer: Medicare PPO | Admitting: Anesthesiology

## 2022-08-08 ENCOUNTER — Ambulatory Visit (HOSPITAL_BASED_OUTPATIENT_CLINIC_OR_DEPARTMENT_OTHER): Payer: Medicare PPO | Admitting: Anesthesiology

## 2022-08-08 ENCOUNTER — Encounter (HOSPITAL_COMMUNITY): Payer: Self-pay | Admitting: Ophthalmology

## 2022-08-08 ENCOUNTER — Encounter (HOSPITAL_COMMUNITY)
Admission: RE | Disposition: A | Discharge status: Home / Self Care | Payer: Self-pay | Source: Home / Self Care | Attending: Ophthalmology

## 2022-08-08 DIAGNOSIS — H02831 Dermatochalasis of right upper eyelid: Secondary | ICD-10-CM

## 2022-08-08 DIAGNOSIS — H02833 Dermatochalasis of right eye, unspecified eyelid: Secondary | ICD-10-CM

## 2022-08-08 DIAGNOSIS — H02835 Dermatochalasis of left lower eyelid: Secondary | ICD-10-CM | POA: Insufficient documentation

## 2022-08-08 DIAGNOSIS — I10 Essential (primary) hypertension: Secondary | ICD-10-CM | POA: Insufficient documentation

## 2022-08-08 DIAGNOSIS — H02832 Dermatochalasis of right lower eyelid: Secondary | ICD-10-CM | POA: Diagnosis not present

## 2022-08-08 DIAGNOSIS — H02834 Dermatochalasis of left upper eyelid: Secondary | ICD-10-CM | POA: Insufficient documentation

## 2022-08-08 DIAGNOSIS — Z79899 Other long term (current) drug therapy: Secondary | ICD-10-CM | POA: Diagnosis not present

## 2022-08-08 DIAGNOSIS — E039 Hypothyroidism, unspecified: Secondary | ICD-10-CM | POA: Diagnosis not present

## 2022-08-08 HISTORY — PX: BROW LIFT: SHX178

## 2022-08-08 SURGERY — BLEPHAROPLASTY
Anesthesia: Monitor Anesthesia Care | Site: Eye | Laterality: Bilateral

## 2022-08-08 MED ORDER — ORAL CARE MOUTH RINSE: 15.0000 mL | Freq: Once | OROMUCOSAL | Status: DC

## 2022-08-08 MED ORDER — ERYTHROMYCIN 5 MG/GM OP OINT
TOPICAL_OINTMENT | Freq: Once | OPHTHALMIC | Status: DC
  Filled 2022-08-08: qty 3.5

## 2022-08-08 MED ORDER — MIDAZOLAM HCL 2 MG/2ML IJ SOLN
INTRAMUSCULAR | Status: DC | PRN
  Administered 2022-08-08: 1 mg via INTRAVENOUS

## 2022-08-08 MED ORDER — TETRACAINE HCL 0.5 % OP SOLN
1.0000 [drp] | OPHTHALMIC | Status: DC | PRN
  Administered 2022-08-08 (×2): 1 [drp] via OPHTHALMIC
  Filled 2022-08-08 (×2): qty 4

## 2022-08-08 MED ORDER — LIDOCAINE-EPINEPHRINE (PF) 2 %-1:200000 IJ SOLN
10.0000 mL | Freq: Once | INTRAMUSCULAR | Status: DC
  Filled 2022-08-08: qty 10

## 2022-08-08 MED ORDER — CHLORHEXIDINE GLUCONATE 0.12 % MT SOLN: 15.0000 mL | Freq: Once | OROMUCOSAL | Status: DC

## 2022-08-08 MED ORDER — 0.9 % SODIUM CHLORIDE (POUR BTL) OPTIME
TOPICAL | Status: DC | PRN
  Administered 2022-08-08: 25 mL

## 2022-08-08 MED ORDER — FENTANYL CITRATE (PF) 100 MCG/2ML IJ SOLN
INTRAMUSCULAR | Status: AC
  Filled 2022-08-08: qty 2

## 2022-08-08 MED ORDER — LIDOCAINE-EPINEPHRINE (PF) 2 %-1:200000 IJ SOLN
INTRAMUSCULAR | Status: DC | PRN
  Administered 2022-08-08: 7 mL via INTRADERMAL

## 2022-08-08 MED ORDER — SODIUM CHLORIDE 0.9% FLUSH
INTRAVENOUS | Status: DC | PRN
  Administered 2022-08-08: 3 mL via INTRAVENOUS

## 2022-08-08 MED ORDER — MIDAZOLAM HCL 2 MG/2ML IJ SOLN
INTRAMUSCULAR | Status: AC
  Filled 2022-08-08: qty 2

## 2022-08-08 MED ORDER — TETRACAINE HCL 0.5 % OP SOLN
1.0000 [drp] | OPHTHALMIC | Status: AC | PRN
  Administered 2022-08-08: 1 [drp] via OPHTHALMIC

## 2022-08-08 MED ORDER — TETRACAINE HCL 0.5 % OP SOLN
1.0000 [drp] | OPHTHALMIC | Status: AC
  Administered 2022-08-08 (×3): 1 [drp] via OPHTHALMIC

## 2022-08-08 MED ORDER — ERYTHROMYCIN 5 MG/GM OP OINT
TOPICAL_OINTMENT | OPHTHALMIC | Status: DC | PRN
  Administered 2022-08-08: 1 via OPHTHALMIC

## 2022-08-08 SURGICAL SUPPLY — 23 items
APL SWBSTK 6 STRL LF DISP (MISCELLANEOUS) ×3
APPLICATOR COTTON TIP 6 STRL (MISCELLANEOUS) ×6 IMPLANT
APPLICATOR COTTON TIP 6IN STRL (MISCELLANEOUS) ×3
BLADE SURG 15 STRL LF DISP TIS (BLADE) ×2 IMPLANT
BLADE SURG 15 STRL SS (BLADE) ×2
CAUTERY SURG HI TEMP FINE TP (MISCELLANEOUS) ×1 IMPLANT
CLOTH BEACON ORANGE TIMEOUT ST (SAFETY) ×1 IMPLANT
COVER SURGICAL LIGHT HANDLE (MISCELLANEOUS) IMPLANT
GAUZE SPONGE 4X4 12PLY STRL (GAUZE/BANDAGES/DRESSINGS) ×1 IMPLANT
GLOVE BIOGEL PI IND STRL 7.0 (GLOVE) ×2 IMPLANT
GLOVE SS BIOGEL STRL SZ 6.5 (GLOVE) IMPLANT
GLOVE SURG SS PI 7.5 STRL IVOR (GLOVE) IMPLANT
GOWN STRL REUS W/TWL LRG LVL3 (GOWN DISPOSABLE) ×1 IMPLANT
GOWN STRL REUS W/TWL XL LVL3 (GOWN DISPOSABLE) ×1 IMPLANT
KIT BLADEGUARD II DBL (SET/KITS/TRAYS/PACK) ×1 IMPLANT
MARKER SKIN DUAL TIP RULER LAB (MISCELLANEOUS) ×1 IMPLANT
NDL HYPO 25X5/8 SAFETYGLIDE (NEEDLE) ×1 IMPLANT
NEEDLE HYPO 25X5/8 SAFETYGLIDE (NEEDLE) ×1 IMPLANT
PACK BASIC III (CUSTOM PROCEDURE TRAY) ×1
PACK SRG BSC III STRL LF ECLPS (CUSTOM PROCEDURE TRAY) ×1 IMPLANT
SET BASIN LINEN APH (SET/KITS/TRAYS/PACK) ×1 IMPLANT
SUT 6 0 PLAIN (SUTURE) ×1 IMPLANT
SYR CONTROL 10ML LL (SYRINGE) ×1 IMPLANT

## 2022-08-08 NOTE — Discharge Instructions (Signed)
Please discharge patient when stable.  Will follow instructions for ice 20 minutes on 20 minutes off while awake for 48 hours.  Antibiotic 4x/day upper eyelids.  Follow up as scheduled.  [facility] Address:  [address]  [place], [place] [address]

## 2022-08-08 NOTE — Interval H&P Note (Signed)
History and Physical Interval Note:  08/08/2022 1:00 PM  Rebecca Gilbert  has presented today for surgery, with the diagnosis of dermatochalasis; right upper lide, left upper lid.  The various methods of treatment have been discussed with the patient and family. After consideration of risks, benefits and other options for treatment, the patient has consented to  Procedure(s): BLEPHAROPLASTY (Bilateral) as a surgical intervention.  The patient's history has been reviewed, patient examined, no change in status, stable for surgery.  I have reviewed the patient's chart and labs.  Questions were answered to the patient's satisfaction.     Baruch Goldmann

## 2022-08-08 NOTE — Anesthesia Preprocedure Evaluation (Signed)
Anesthesia Evaluation  Patient identified by MRN, date of birth, ID band Patient awake    Reviewed: Allergy & Precautions, H&P , NPO status , Patient's Chart, lab work & pertinent test results  History of Anesthesia Complications Negative for: history of anesthetic complications  Airway Mallampati: II  TM Distance: >3 FB Neck ROM: Full    Dental  (+) Dental Advisory Given, Teeth Intact   Pulmonary neg pulmonary ROS   Pulmonary exam normal breath sounds clear to auscultation       Cardiovascular hypertension, Pt. on medications Normal cardiovascular exam Rhythm:Regular Rate:Normal     Neuro/Psych negative neurological ROS  negative psych ROS   GI/Hepatic negative GI ROS, Neg liver ROS,,,  Endo/Other  Hypothyroidism    Renal/GU negative Renal ROS  negative genitourinary   Musculoskeletal negative musculoskeletal ROS (+)    Abdominal   Peds negative pediatric ROS (+)  Hematology negative hematology ROS (+)   Anesthesia Other Findings   Reproductive/Obstetrics negative OB ROS                             Anesthesia Physical Anesthesia Plan  ASA: 2  Anesthesia Plan: MAC   Post-op Pain Management: Minimal or no pain anticipated   Induction: Intravenous  PONV Risk Score and Plan: Treatment may vary due to age or medical condition  Airway Management Planned: Natural Airway and Nasal Cannula  Additional Equipment:   Intra-op Plan:   Post-operative Plan:   Informed Consent: I have reviewed the patients History and Physical, chart, labs and discussed the procedure including the risks, benefits and alternatives for the proposed anesthesia with the patient or authorized representative who has indicated his/her understanding and acceptance.     Dental advisory given  Plan Discussed with: CRNA and Surgeon  Anesthesia Plan Comments:         Anesthesia Quick Evaluation

## 2022-08-08 NOTE — Anesthesia Postprocedure Evaluation (Signed)
Anesthesia Post Note  Patient: Rebecca Gilbert [facility]  Procedure(s) Performed: BLEPHAROPLASTY (Bilateral: Eye)  Patient location during evaluation: Phase II Anesthesia Type: MAC Level of consciousness: awake and alert and oriented Pain management: pain level controlled Vital Signs Assessment: post-procedure vital signs reviewed and stable Respiratory status: spontaneous breathing, nonlabored ventilation and respiratory function stable Cardiovascular status: blood pressure returned to baseline and stable Postop Assessment: no apparent nausea or vomiting Anesthetic complications: no  No notable events documented.   Last Vitals:  Vitals:   08/08/22 1300 08/08/22 1426  BP: (!) 146/76 127/72  Pulse: 70 74  Resp: 20 18  Temp: (P) 36.5 C 37 C  SpO2: 100% 100%    Last Pain:  Vitals:   08/08/22 1428  PainSc: 0-No pain                 Lind Ausley C Azyria Osmon

## 2022-08-08 NOTE — Anesthesia Procedure Notes (Addendum)
Date/Time: 08/08/2022 1:45 PM  Performed by: Vista Deck, CRNAPre-anesthesia Checklist: Patient identified, Emergency Drugs available, Suction available, Timeout performed and Patient being monitored Patient Re-evaluated:Patient Re-evaluated prior to induction Oxygen Delivery Method: Nasal Cannula

## 2022-08-08 NOTE — Op Note (Signed)
Procedure Date: 08/08/22  Preoperative Diagnosis: Visually significant dermatochalasis, both eyes   Postoperative Diagnosis:  Visually significant dermatochalasis, both eyes   Procedure Performed:  Bilateral upper lid blepharoplasty    Surgeon: Baruch Goldmann, M.D.  Assistants: None  Anesthesia:  1. Local infiltrative 2% lidocaine with epinephrine 1:100,000 2. MAC  Specimens: None   Estimated Blood Loss: Less than 1 mL   Complications: None    Indications for Surgery:   Because of the visually significant dermatochalasis of both eyes, it was recommended to do bilateral upper lid blepharoplasty.   The common risks and benefits of surgery were discussed, including inability to close the eyes, over-correction, under-correction, asymmetry, corneal exposure, double vision, blindness and undesired cosmetic result.   Procedure: The patient was brought back to the operating room and was placed in the supine position. The patient was prepped and draped in the usual sterile fashion. Calipers were used to measure ?? mm above the upper lash line in both eyes. A forceps pinch technique was used to carefully measure the amount of skin to be taken off both eyes and care was taken that no lagophthalmos would likely result. From that point a surgical marking pen was used to mark the appropriate amount of skin to be taken from both eyes. Both surgical sites were then injected subcutaneously with local anesthetic (2% lidocaine with epinephrine 1:100,000). After adequate anesthesia was found to be achieved, attention was turned to the left side where a 15 blade was used to incise the skin along the premarked lines. After this was achieved, the cutaneous layer was removed using Westcott scissors and forceps.  Hemostasis was achieved with electocautery.  Three interrupted fast absorbing 6-0 gut sutures were placed. A running 6-0 gut suture was used to close the skin. After this was achieved, attention was  turned to the right eye where a similar procedure was performed, including using a 15 blade to incise the skin, using Westcott scissors and forceps to remove the skin, cautery, and using 6-0 gut sutures, first interrupted and then running, to finish the wound.  After the wound was closed and adequate hemostasis was found to be achieved, the drape was removed and patient was cleaned with wet and dry 4x4s, and ?? ointment was applied to both suture lines. Ice packs were placed over both eyes.   Post-Op Plan/Instructions: A prescription for erythromycin ointment was given. An instruction sheet was given. Ice packs are to be used for 48 hours 20 minutes on and 20 minutes off while awake for 2 days. Antibiotic ointment used for 10 days. A follow up visit will in two weeks in clinic. Patient instructed to call if signs of bleeding, infection, significant pain, or any other concerning symptoms.  Disposition: Home under self care.

## 2022-08-08 NOTE — Transfer of Care (Signed)
Immediate Anesthesia Transfer of Care Note  Patient: Rebecca Gilbert [facility]  Procedure(s) Performed: BLEPHAROPLASTY (Bilateral: Eye)  Patient Location: Short Stay  Anesthesia Type:MAC  Level of Consciousness: awake and patient cooperative  Airway & Oxygen Therapy: Patient Spontanous Breathing  Post-op Assessment: Report given to RN and Post -op Vital signs reviewed and stable  Post vital signs: Reviewed and stable  Last Vitals:  Vitals Value Taken Time  BP 127/72 08/08/22 1426  Temp 37 C 08/08/22 1426  Pulse 74 08/08/22 1426  Resp 18 08/08/22 1426  SpO2 100 % 08/08/22 1426    Last Pain:  Vitals:   08/08/22 1300  PainSc: (P) 0-No pain         Complications: No notable events documented.

## 2022-08-17 ENCOUNTER — Encounter (HOSPITAL_COMMUNITY): Payer: Self-pay | Admitting: Ophthalmology

## 2022-09-28 ENCOUNTER — Other Ambulatory Visit (HOSPITAL_COMMUNITY): Payer: Self-pay | Admitting: Family Medicine

## 2022-09-28 DIAGNOSIS — Z1231 Encounter for screening mammogram for malignant neoplasm of breast: Secondary | ICD-10-CM

## 2022-09-28 DIAGNOSIS — M810 Age-related osteoporosis without current pathological fracture: Secondary | ICD-10-CM

## 2022-10-12 ENCOUNTER — Ambulatory Visit (HOSPITAL_COMMUNITY)
Admission: RE | Admit: 2022-10-12 | Discharge: 2022-10-12 | Disposition: A | Discharge status: Home / Self Care | Payer: Medicare PPO | Source: Ambulatory Visit | Attending: Family Medicine | Admitting: Family Medicine

## 2022-10-12 ENCOUNTER — Encounter (HOSPITAL_COMMUNITY): Payer: Self-pay

## 2022-10-12 DIAGNOSIS — Z1231 Encounter for screening mammogram for malignant neoplasm of breast: Secondary | ICD-10-CM | POA: Insufficient documentation

## 2022-10-12 DIAGNOSIS — M810 Age-related osteoporosis without current pathological fracture: Secondary | ICD-10-CM | POA: Insufficient documentation

## 2023-02-21 ENCOUNTER — Telehealth: Payer: Self-pay | Admitting: Internal Medicine

## 2023-02-21 NOTE — Telephone Encounter (Signed)
Patient is calling- would like to establish care with our office, currently has no PCP. Please advise on scheduling.  Thank you

## 2023-03-02 NOTE — Telephone Encounter (Signed)
Pt sched 05/21/23 10:00

## 2023-05-22 ENCOUNTER — Encounter: Payer: Self-pay | Admitting: Internal Medicine

## 2023-05-22 ENCOUNTER — Ambulatory Visit: Payer: Medicare PPO | Admitting: Internal Medicine

## 2023-05-22 VITALS — BP 132/78 | HR 86 | Ht 61.5 in | Wt 156.0 lb

## 2023-05-22 DIAGNOSIS — Z1211 Encounter for screening for malignant neoplasm of colon: Secondary | ICD-10-CM | POA: Insufficient documentation

## 2023-05-22 DIAGNOSIS — E785 Hyperlipidemia, unspecified: Secondary | ICD-10-CM | POA: Insufficient documentation

## 2023-05-22 DIAGNOSIS — E782 Mixed hyperlipidemia: Secondary | ICD-10-CM | POA: Diagnosis not present

## 2023-05-22 DIAGNOSIS — E039 Hypothyroidism, unspecified: Secondary | ICD-10-CM | POA: Diagnosis not present

## 2023-05-22 DIAGNOSIS — I10 Essential (primary) hypertension: Secondary | ICD-10-CM | POA: Insufficient documentation

## 2023-05-22 DIAGNOSIS — M810 Age-related osteoporosis without current pathological fracture: Secondary | ICD-10-CM | POA: Insufficient documentation

## 2023-05-22 DIAGNOSIS — R413 Other amnesia: Secondary | ICD-10-CM | POA: Insufficient documentation

## 2023-05-22 DIAGNOSIS — N952 Postmenopausal atrophic vaginitis: Secondary | ICD-10-CM | POA: Insufficient documentation

## 2023-05-22 MED ORDER — LEVOTHYROXINE SODIUM 75 MCG PO TABS: 75.0000 ug | ORAL_TABLET | Freq: Every day | ORAL | 3 refills | Status: AC

## 2023-05-22 MED ORDER — ROSUVASTATIN CALCIUM 5 MG PO TABS: 5.0000 mg | ORAL_TABLET | Freq: Every day | ORAL | 3 refills | Status: DC

## 2023-05-22 MED ORDER — LOSARTAN POTASSIUM 100 MG PO TABS: 100.0000 mg | ORAL_TABLET | Freq: Every day | ORAL | 3 refills | Status: AC

## 2023-05-22 MED ORDER — ALENDRONATE SODIUM 70 MG PO TABS: 70.0000 mg | ORAL_TABLET | ORAL | 2 refills | Status: DC

## 2023-05-22 NOTE — Patient Instructions (Signed)
 It was a pleasure to see you today.  Thank you for giving us  the opportunity to be involved in your care.  Below is a brief recap of your visit and next steps.  We will plan to see you again in 6 months.  Summary You have established care today No medication changes were made. Refills provided Gastroenterology referral placed for colonoscopy Follow up in 6 months

## 2023-05-22 NOTE — Assessment & Plan Note (Signed)
 She was prescribed rosuvastatin 5 mg daily in July and also takes a daily fish oil supplement.  Lipid panel updated in October shows that her cholesterol is adequately controlled on this regimen.  No changes are indicated today.

## 2023-05-22 NOTE — Assessment & Plan Note (Signed)
 Currently prescribed losartan 100 mg daily.  No medication changes are indicated today.

## 2023-05-22 NOTE — Assessment & Plan Note (Signed)
 Previously documented history of osteoporosis.  She is currently prescribed weekly Fosamax and is also on vitamin D and calcium supplementation.

## 2023-05-22 NOTE — Assessment & Plan Note (Signed)
 She is currently but levothyroxine 75 mcg daily.  Asymptomatic.  Thyroid studies updated in October were within normal limits.

## 2023-05-22 NOTE — Assessment & Plan Note (Signed)
 Currently prescribed estradiol cream

## 2023-05-22 NOTE — Assessment & Plan Note (Signed)
 Her acute concern today is changes in memory.  She has previously discussed this with her former PCP.  She will occasionally find herself questioning why she is performing activities and reports briefly putting a pot on the stove and turning it on.  She is able to perform daily activities independently, continues to drive, and participates in managing household finances.  She is not interested in further workup at this time and would like to keep her concerns private from family. -Reassurance provided today.  No further workup or evaluation per patient request.  We reviewed the importance of maintaining regular cardiovascular exercise, social engagement, and mentally stimulating activities such as reading, crossword puzzles, and word searches.  Consider further workup if she has additional concerns or notices abnormal behavior more frequently.

## 2023-05-22 NOTE — Assessment & Plan Note (Signed)
 Last colonoscopy completed in 2015.  More recently, a FIT test was completed in November 2022.  She is due for colon cancer screening.  Screening options were reviewed today and she would like to proceed with screening colonoscopy.  Gastroenterology referral placed today.

## 2023-05-22 NOTE — Progress Notes (Signed)
 New Patient Office Visit  Subjective    Patient ID: Rebecca Gilbert, female    DOB: 1950/05/30  Age: 73 y.o. MRN: [id number]  CC:  Chief Complaint  Patient presents with   Establish Care    HPI Rebecca Gilbert presents to establish care.  She is a 73 year old woman who endorses a past medical history significant for HTN, HLD, hypothyroidism, and osteoporosis.  Previously followed by Dr. Sophronia at [facility] in [place].  Rebecca Gilbert reports feeling well today.  She is asymptomatic currently.  Her acute concerns are requesting vaccine guidance and wanting to discuss memory changes.  She states that she will occasionally find herself questioning why she is performing certain activities, such as putting a pot on the stove.  She does not get lost while driving but is more fearful of driving.  Ultimately, she is concerned for dementia but does not want to undergo further evaluation and does not want her family to know about her concerns.  Rebecca Gilbert is currently retired.  She denies tobacco and illicit drug use.  She drinks 1 glass of wine weekly.  Family medical history is significant for liver disease, unspecified cancer, CVA, and CAD.  Acute concerns, chronic medical conditions, and outstanding preventative care items discussed today are individually addressed in A/P below.  Outpatient Encounter Medications as of 05/22/2023  Medication Sig   Calcium  Citrate-Vitamin D (CITRACAL + D PO) Take 2 tablets by mouth daily.   Cholecalciferol (VITAMIN D) 50 MCG (2000 UT) CAPS Take 2,000 Units by mouth daily.   Omega-3 Fatty Acids (FISH OIL) 1200 MG CAPS Take 1,200 mg by mouth daily.   vitamin E 1000 UNIT capsule Take 1,000 Units by mouth daily.   zinc gluconate 50 MG tablet Take 50 mg by mouth daily.   [DISCONTINUED] alendronate  (FOSAMAX ) 70 MG tablet Take 70 mg by mouth every Sunday. Take with a full glass of water  on an empty stomach.   [DISCONTINUED] levothyroxine  (SYNTHROID ) 75 MCG tablet Take 75  mcg by mouth daily before breakfast.   [DISCONTINUED] losartan  (COZAAR ) 100 MG tablet Take 100 mg by mouth daily.   [DISCONTINUED] rosuvastatin  (CRESTOR ) 5 MG tablet Take 5 mg by mouth daily.   [START ON 05/28/2023] alendronate  (FOSAMAX ) 70 MG tablet Take 1 tablet (70 mg total) by mouth every Sunday. Take with a full glass of water  on an empty stomach.   estradiol (ESTRACE) 0.1 MG/GM vaginal cream Place 1 Applicatorful vaginally once a week. (Patient not taking: Reported on 05/22/2023)   levothyroxine  (SYNTHROID ) 75 MCG tablet Take 1 tablet (75 mcg total) by mouth daily before breakfast.   losartan  (COZAAR ) 100 MG tablet Take 1 tablet (100 mg total) by mouth daily.   rosuvastatin  (CRESTOR ) 5 MG tablet Take 1 tablet (5 mg total) by mouth daily.   No facility-administered encounter medications on file as of 05/22/2023.    Past Medical History:  Diagnosis Date   Hypertension    Hypothyroidism    Osteoporosis     Past Surgical History:  Procedure Laterality Date   BROW LIFT Bilateral 08/08/2022   Procedure: BLEPHAROPLASTY;  Surgeon: Harrie Agent, MD;  Location: [facility];  Service: Ophthalmology;  Laterality: Bilateral;   CATARACT EXTRACTION W/PHACO Right 09/07/2020   Procedure: CATARACT EXTRACTION PHACO AND INTRAOCULAR LENS PLACEMENT RIGHT EYE;  Surgeon: Harrie Agent, MD;  Location: [facility];  Service: Ophthalmology;  Laterality: Right;  right CDE=8.32   CATARACT EXTRACTION W/PHACO Left 09/21/2020   Procedure: CATARACT EXTRACTION PHACO AND  INTRAOCULAR LENS PLACEMENT LEFT EYE;  Surgeon: Harrie Agent, MD;  Location: [facility];  Service: Ophthalmology;  Laterality: Left;  left CDE=7.06    Family History  Problem Relation Age of Onset   Breast cancer Cousin     Social History   Socioeconomic History   Marital status: Married    Spouse name: Not on file   Number of children: Not on file   Years of education: Not on file   Highest education level: Bachelor's degree (e.g., BA, AB, BS)   Occupational History   Not on file  Tobacco Use   Smoking status: Never   Smokeless tobacco: Never  Vaping Use   Vaping status: Never Used  Substance and Sexual Activity   Alcohol use: Yes    Alcohol/week: 1.0 standard drink of alcohol    Types: 1 Glasses of wine per week    Comment: 1 glass wine weekly   Drug use: Never   Sexual activity: Yes  Other Topics Concern   Not on file  Social History Narrative   Not on file   Social Drivers of Health   Financial Resource Strain: Low Risk  (05/19/2023)   Overall Financial Resource Strain (CARDIA)    Difficulty of Paying Living Expenses: Not hard at all  Food Insecurity: No Food Insecurity (05/19/2023)   Hunger Vital Sign    Worried About Running Out of Food in the Last Year: Never true    Ran Out of Food in the Last Year: Never true  Transportation Needs: No Transportation Needs (05/19/2023)   PRAPARE - Administrator, Civil Service (Medical): No    Lack of Transportation (Non-Medical): No  Physical Activity: Sufficiently Active (05/19/2023)   Exercise Vital Sign    Days of Exercise per Week: 5 days    Minutes of Exercise per Session: 30 min  Stress: Stress Concern Present (05/19/2023)   [employer] of Occupational Health - Occupational Stress Questionnaire    Feeling of Stress : To some extent  Social Connections: Socially Integrated (05/19/2023)   Social Connection and Isolation Panel [NHANES]    Frequency of Communication with Friends and Family: More than three times a week    Frequency of Social Gatherings with Friends and Family: Twice a week    Attends Religious Services: More than 4 times per year    Active Member of [employer] or Organizations: Yes    Attends Engineer, Structural: More than 4 times per year    Marital Status: Married  Catering Manager Violence: Not At Risk (11/19/2022)   Received from [facility]   HITS    Over the last 12 months how often did your partner physically hurt you?: Never     Over the last 12 months how often did your partner insult you or talk down to you?: Never    Over the last 12 months how often did your partner threaten you with physical harm?: Never    Over the last 12 months how often did your partner scream or curse at you?: Never   Review of Systems  Constitutional:  Negative for chills and fever.  HENT:  Negative for sore throat.   Respiratory:  Negative for cough and shortness of breath.   Cardiovascular:  Negative for chest pain, palpitations and leg swelling.  Gastrointestinal:  Negative for abdominal pain, blood in stool, constipation, diarrhea, nausea and vomiting.  Genitourinary:  Negative for dysuria and hematuria.  Musculoskeletal:  Negative for myalgias.  Skin:  Negative for itching and rash.  Neurological:  Negative for dizziness and headaches.  Psychiatric/Behavioral:  Negative for depression and suicidal ideas.    Objective    BP 132/78   Pulse 86   Ht 5' 1.5 (1.562 m)   Wt 156 lb (70.8 kg)   SpO2 96%   BMI 29.00 kg/m   Physical Exam Vitals reviewed.  Constitutional:      General: She is not in acute distress.    Appearance: Normal appearance. She is not toxic-appearing.  HENT:     Head: Normocephalic and atraumatic.     Right Ear: External ear normal.     Left Ear: External ear normal.     Nose: Nose normal. No congestion or rhinorrhea.     Mouth/Throat:     Mouth: Mucous membranes are moist.     Pharynx: Oropharynx is clear. No oropharyngeal exudate or posterior oropharyngeal erythema.  Eyes:     General: No scleral icterus.    Extraocular Movements: Extraocular movements intact.     Conjunctiva/sclera: Conjunctivae normal.     Pupils: Pupils are equal, round, and reactive to light.  Cardiovascular:     Rate and Rhythm: Normal rate and regular rhythm.     Pulses: Normal pulses.     Heart sounds: Normal heart sounds. No murmur heard.    No friction rub. No gallop.  Pulmonary:     Effort: Pulmonary effort is  normal.     Breath sounds: Normal breath sounds. No wheezing, rhonchi or rales.  Abdominal:     General: Abdomen is flat. Bowel sounds are normal. There is no distension.     Palpations: Abdomen is soft.     Tenderness: There is no abdominal tenderness.  Musculoskeletal:        General: No swelling. Normal range of motion.     Cervical back: Normal range of motion.     Right lower leg: No edema.     Left lower leg: No edema.  Lymphadenopathy:     Cervical: No cervical adenopathy.  Skin:    General: Skin is warm and dry.     Capillary Refill: Capillary refill takes less than 2 seconds.     Coloration: Skin is not jaundiced.  Neurological:     General: No focal deficit present.     Mental Status: She is alert and oriented to person, place, and time.  Psychiatric:        Mood and Affect: Mood normal.        Behavior: Behavior normal.    Assessment & Plan:   Problem List Items Addressed This Visit       Essential hypertension   Currently prescribed losartan  100 mg daily.  No medication changes are indicated today.      Acquired hypothyroidism   She is currently but levothyroxine  75 mcg daily.  Asymptomatic.  Thyroid studies updated in October were within normal limits.      Osteoporosis - Primary   Previously documented history of osteoporosis.  She is currently prescribed weekly Fosamax  and is also on vitamin D and calcium  supplementation.      Vaginal atrophy   Currently prescribed estradiol cream      Hyperlipidemia   She was prescribed rosuvastatin  5 mg daily in July and also takes a daily fish oil supplement.  Lipid panel updated in October shows that her cholesterol is adequately controlled on this regimen.  No changes are indicated today.      Colon cancer screening  Last colonoscopy completed in 2015.  More recently, a FIT test was completed in November 2022.  She is due for colon cancer screening.  Screening options were reviewed today and she would like to  proceed with screening colonoscopy.  Gastroenterology referral placed today.      Memory changes   Her acute concern today is changes in memory.  She has previously discussed this with her former PCP.  She will occasionally find herself questioning why she is performing activities and reports briefly putting a pot on the stove and turning it on.  She is able to perform daily activities independently, continues to drive, and participates in managing household finances.  She is not interested in further workup at this time and would like to keep her concerns private from family. -Reassurance provided today.  No further workup or evaluation per patient request.  We reviewed the importance of maintaining regular cardiovascular exercise, social engagement, and mentally stimulating activities such as reading, crossword puzzles, and word searches.  Consider further workup if she has additional concerns or notices abnormal behavior more frequently.      Return in about 6 months (around 11/19/2023).   Manus FORBES Fireman, MD

## 2023-05-25 ENCOUNTER — Encounter: Payer: Self-pay | Admitting: *Deleted

## 2023-07-31 ENCOUNTER — Other Ambulatory Visit: Payer: Self-pay | Admitting: Internal Medicine

## 2023-07-31 DIAGNOSIS — M810 Age-related osteoporosis without current pathological fracture: Secondary | ICD-10-CM

## 2023-11-13 ENCOUNTER — Ambulatory Visit (HOSPITAL_COMMUNITY)
Admission: RE | Admit: 2023-11-13 | Discharge: 2023-11-13 | Disposition: A | Discharge status: Home / Self Care | Source: Ambulatory Visit

## 2023-11-13 ENCOUNTER — Other Ambulatory Visit (HOSPITAL_COMMUNITY): Payer: Self-pay

## 2023-11-13 DIAGNOSIS — Z1231 Encounter for screening mammogram for malignant neoplasm of breast: Secondary | ICD-10-CM | POA: Diagnosis present

## 2023-11-15 ENCOUNTER — Telehealth: Payer: Self-pay

## 2023-11-15 NOTE — Telephone Encounter (Signed)
 Copied from CRM [id number]. Topic: Clinical - Request for Lab/Test Order >> Nov 15, 2023 11:50 AM Treva T wrote: Reason for CRM: Received call from patient, states she has an upcoming appointment with provider, on 11/20/23, and would like labs ordered to check B12 level, and A1C levels, in addition to any other necessary labs.   Patient can be reached at [phone].

## 2023-11-20 ENCOUNTER — Ambulatory Visit: Payer: Medicare PPO | Admitting: Internal Medicine

## 2023-11-20 ENCOUNTER — Ambulatory Visit

## 2023-11-20 VITALS — BP 153/86 | HR 86 | Ht 61.0 in | Wt 151.0 lb

## 2023-11-20 DIAGNOSIS — R413 Other amnesia: Secondary | ICD-10-CM

## 2023-11-20 DIAGNOSIS — E782 Mixed hyperlipidemia: Secondary | ICD-10-CM

## 2023-11-20 DIAGNOSIS — E039 Hypothyroidism, unspecified: Secondary | ICD-10-CM

## 2023-11-20 DIAGNOSIS — Z131 Encounter for screening for diabetes mellitus: Secondary | ICD-10-CM

## 2023-11-20 DIAGNOSIS — I10 Essential (primary) hypertension: Secondary | ICD-10-CM | POA: Diagnosis not present

## 2023-11-20 NOTE — Assessment & Plan Note (Signed)
 Her acute concern today is changes in memory.  She has previously discussed this with her former PCP.  She will occasionally find herself questioning why she is performing activities and reports briefly putting a pot on the stove and turning it on.  She is able to perform daily activities independently and continues to drive locally near her home.  She does not feel comfortable driving on highways.  Her husband has recently taken over management of household finances.   - We will check labs today for further evaluation. - Advised to add B12 oral supplement that dissolves under the tongue while B12 level pending.

## 2023-11-20 NOTE — Progress Notes (Signed)
 Established Patient Office Visit  Subjective   Patient ID: Rebecca Gilbert, female    DOB: May 21, 1950  Age: 73 y.o. MRN: [id number]  Chief Complaint  Patient presents with   Care Management    Six month follow up   lab    Lab work , patient requesting A1c and B12    HPI  Patient is here today with her husband requesting evaluation of reported cognitive decline.  She reports having trouble with numbers and has given over management of bank accounts to her husband for the first time in their 52-year marriage.  She and her husband state that she just is on the same person over the past few years.  This dates that friends and family also report the same findings.  She denies any weakness, blurred vision or dizziness.  Patient Active Problem List   Diagnosis Date Noted   Essential hypertension 05/22/2023   Acquired hypothyroidism 05/22/2023   Hyperlipidemia 05/22/2023   Osteoporosis 05/22/2023   Vaginal atrophy 05/22/2023   Colon cancer screening 05/22/2023   Memory changes 05/22/2023      ROS    Objective:     BP (!) 153/86   Pulse 86   Ht 5' 1 (1.549 m)   Wt 151 lb (68.5 kg)   SpO2 98%   BMI 28.53 kg/m  BP Readings from Last 3 Encounters:  11/20/23 (!) 153/86  05/22/23 132/78  08/08/22 127/72   Wt Readings from Last 3 Encounters:  11/20/23 151 lb (68.5 kg)  05/22/23 156 lb (70.8 kg)  08/03/22 169 lb 15.6 oz (77.1 kg)     Physical Exam Vitals and nursing note reviewed. Exam conducted with a chaperone present (Patient's husband).  Constitutional:      Appearance: Normal appearance.  HENT:     Head: Normocephalic.     Right Ear: Tympanic membrane, ear canal and external ear normal.     Left Ear: Tympanic membrane, ear canal and external ear normal.     Nose: Nose normal.     Mouth/Throat:     Mouth: Mucous membranes are moist.     Pharynx: Oropharynx is clear.  Eyes:     Extraocular Movements: Extraocular movements intact.     Pupils: Pupils are equal,  round, and reactive to light.  Cardiovascular:     Rate and Rhythm: Normal rate and regular rhythm.  Pulmonary:     Effort: Pulmonary effort is normal.     Breath sounds: Normal breath sounds.  Abdominal:     General: Abdomen is flat. Bowel sounds are normal.  Musculoskeletal:        General: Normal range of motion.     Cervical back: Normal range of motion and neck supple.  Skin:    General: Skin is warm and dry.  Neurological:     Mental Status: She is alert and oriented to person, place, and time.  Psychiatric:        Mood and Affect: Mood normal.        Thought Content: Thought content normal.     No results found for any visits on 11/20/23.    The 10-year ASCVD risk score (Arnett DK, et al., 2019) is: 19.8%    Assessment & Plan:   Problem List Items Addressed This Visit       Cardiovascular and Mediastinum   Essential hypertension - Primary   BP is somewhat elevated today, although she reports taking medication as directed.  Continue with current dose of  losartan  and work on a low-sodium diet and regular physical activity.  Recommend follow-up in 2 to 3 months for recheck.      Relevant Orders   CMP14+EGFR     Endocrine   Acquired hypothyroidism   She is currently but levothyroxine  75 mcg daily.  Asymptomatic.  Thyroid studies updated today. Will adjust dose if labs indicate.      Relevant Orders   TSH + free T4     Other   Hyperlipidemia   She was prescribed rosuvastatin  5 mg daily in July and also takes a daily fish oil supplement.   Update lipid panel today. She denies needing refills at this time. Follow-up according to lab results.      Relevant Orders   CMP14+EGFR   Lipid Profile   Memory changes   Her acute concern today is changes in memory.  She has previously discussed this with her former PCP.  She will occasionally find herself questioning why she is performing activities and reports briefly putting a pot on the stove and turning it on.   She is able to perform daily activities independently and continues to drive locally near her home.  She does not feel comfortable driving on highways.  Her husband has recently taken over management of household finances.   - We will check labs today for further evaluation. - Advised to add B12 oral supplement that dissolves under the tongue while B12 level pending.      Relevant Orders   B12 and Folate Panel   Other Visit Diagnoses       Diabetes mellitus screening       Relevant Orders   HgB A1c       Return in about 3 months (around 02/20/2024) for chronic follow-up with PCP, as needed.    Leita Longs, FNP

## 2023-11-20 NOTE — Assessment & Plan Note (Signed)
 She is currently but levothyroxine  75 mcg daily.  Asymptomatic.  Thyroid studies updated today. Will adjust dose if labs indicate.

## 2023-11-20 NOTE — Assessment & Plan Note (Signed)
 She was prescribed rosuvastatin  5 mg daily in July and also takes a daily fish oil supplement.   Update lipid panel today. She denies needing refills at this time. Follow-up according to lab results.

## 2023-11-20 NOTE — Assessment & Plan Note (Signed)
 BP is somewhat elevated today, although she reports taking medication as directed.  Continue with current dose of losartan  and work on a low-sodium diet and regular physical activity.  Recommend follow-up in 2 to 3 months for recheck.

## 2023-11-21 LAB — CMP14+EGFR
ALT: 15 IU/L (ref 0–32)
AST: 26 IU/L (ref 0–40)
Albumin: 4.5 g/dL (ref 3.8–4.8)
Alkaline Phosphatase: 55 IU/L (ref 44–121)
BUN/Creatinine Ratio: 14 (ref 12–28)
BUN: 10 mg/dL (ref 8–27)
Bilirubin Total: 0.4 mg/dL (ref 0.0–1.2)
CO2: 21 mmol/L (ref 20–29)
Calcium: 9.2 mg/dL (ref 8.7–10.3)
Chloride: 98 mmol/L (ref 96–106)
Creatinine, Ser: 0.71 mg/dL (ref 0.57–1.00)
Globulin, Total: 2.4 g/dL (ref 1.5–4.5)
Glucose: 84 mg/dL (ref 70–99)
Potassium: 4.4 mmol/L (ref 3.5–5.2)
Sodium: 134 mmol/L (ref 134–144)
Total Protein: 6.9 g/dL (ref 6.0–8.5)
eGFR: 90 mL/min/1.73 (ref >59)

## 2023-11-21 LAB — TSH+FREE T4
Free T4: 1.42 ng/dL (ref 0.82–1.77)
TSH: 0.846 u[IU]/mL (ref 0.450–4.500)

## 2023-11-21 LAB — LIPID PANEL
Chol/HDL Ratio: 1.9 ratio (ref 0.0–4.4)
Cholesterol, Total: 171 mg/dL (ref 100–199)
HDL: 92 mg/dL (ref >39)
LDL Chol Calc (NIH): 69 mg/dL (ref 0–99)
Triglycerides: 47 mg/dL (ref 0–149)
VLDL Cholesterol Cal: 10 mg/dL (ref 5–40)

## 2023-11-21 LAB — HEMOGLOBIN A1C
Est. average glucose Bld gHb Est-mCnc: 114 mg/dL
Hgb A1c MFr Bld: 5.6 % (ref 4.8–5.6)

## 2023-11-21 LAB — B12 AND FOLATE PANEL
Folate: >20 ng/mL (ref >3.0)
Vitamin B-12: 541 pg/mL (ref 232–1245)

## 2023-11-23 ENCOUNTER — Encounter (INDEPENDENT_AMBULATORY_CARE_PROVIDER_SITE_OTHER): Payer: Self-pay | Admitting: *Deleted

## 2023-11-23 ENCOUNTER — Ambulatory Visit: Payer: Self-pay

## 2023-11-27 ENCOUNTER — Ambulatory Visit: Payer: Self-pay

## 2024-02-05 ENCOUNTER — Ambulatory Visit: Payer: Self-pay

## 2024-02-05 ENCOUNTER — Telehealth: Payer: Self-pay

## 2024-02-05 VITALS — BP 144/82 | HR 78 | Ht 61.0 in | Wt 152.0 lb

## 2024-02-05 DIAGNOSIS — R413 Other amnesia: Secondary | ICD-10-CM

## 2024-02-05 DIAGNOSIS — I10 Essential (primary) hypertension: Secondary | ICD-10-CM

## 2024-02-05 DIAGNOSIS — M810 Age-related osteoporosis without current pathological fracture: Secondary | ICD-10-CM | POA: Diagnosis not present

## 2024-02-05 MED ORDER — CALCIUM CITRATE + D 250-5 MG-MCG PO TABS: 1.0000 | ORAL_TABLET | Freq: Two times a day (BID) | ORAL | 11 refills | Status: AC

## 2024-02-05 NOTE — Progress Notes (Unsigned)
 Established Patient Office Visit  Subjective   Patient ID: Rebecca Gilbert, female    DOB: 1951/03/06  Age: 73 y.o. MRN: [id number]  Chief Complaint  Patient presents with   Hypertension    One month follow up   Arm Problem    Hypertension  Discussed the use of AI scribe software for clinical note transcription with the patient, who gave verbal consent to proceed.  History of Present Illness   Rebecca Gilbert is a 73 year old female who presents with driving anxiety and upper arm pain.  Driving-related anxiety - Experiences anxiety while driving her new [employer], particularly with maintaining speed and lane position - Describes a persistent 'fear factor' while driving despite feeling she operates the car well - No history of motor vehicle accidents - Has not driven out of town yet  Bilateral upper arm pain - Intense pain in both upper arms, especially with reaching or moving arms at certain angles - Pain is not superficial and occurs only with specific movements, resolving quickly after movement stops - Pain is more pronounced in the right arm, which is used more frequently - No history of significant injuries or accidents to explain the pain  Elevated blood pressure - Elevated blood pressure noted at home, attributed to coffee consumption prior to measurement - Previous laboratory results were normal       Patient Active Problem List   Diagnosis Date Noted   Essential hypertension 05/22/2023   Acquired hypothyroidism 05/22/2023   Hyperlipidemia 05/22/2023   Osteoporosis 05/22/2023   Vaginal atrophy 05/22/2023   Colon cancer screening 05/22/2023   Memory changes 05/22/2023    ROS    Objective:     BP (!) 144/82 (BP Location: Left Arm, Patient Position: Sitting, Cuff Size: Normal)   Pulse 78   Ht 5' 1 (1.549 m)   Wt 152 lb (68.9 kg)   SpO2 98%   BMI 28.72 kg/m  BP Readings from Last 3 Encounters:  02/05/24 (!) 144/82  11/20/23 (!) 153/86  05/22/23  132/78   Wt Readings from Last 3 Encounters:  02/05/24 152 lb (68.9 kg)  11/20/23 151 lb (68.5 kg)  05/22/23 156 lb (70.8 kg)      Physical Exam Vitals and nursing note reviewed.  Constitutional:      Appearance: Normal appearance.  HENT:     Head: Normocephalic.  Eyes:     Extraocular Movements: Extraocular movements intact.     Pupils: Pupils are equal, round, and reactive to light.  Cardiovascular:     Rate and Rhythm: Normal rate and regular rhythm.  Pulmonary:     Effort: Pulmonary effort is normal.     Breath sounds: Normal breath sounds.  Musculoskeletal:     Cervical back: Normal range of motion and neck supple.  Neurological:     Mental Status: She is alert and oriented to person, place, and time.  Psychiatric:        Mood and Affect: Mood normal.        Thought Content: Thought content normal.     No results found for any visits on 02/05/24.  Last CBC No results found for: WBC, HGB, HCT, MCV, MCH, RDW, PLT Last metabolic panel Lab Results  Component Value Date   GLUCOSE 84 11/20/2023   NA 134 11/20/2023   K 4.4 11/20/2023   CL 98 11/20/2023   CO2 21 11/20/2023   BUN 10 11/20/2023   CREATININE 0.71 11/20/2023   EGFR 90 11/20/2023  CALCIUM  9.2 11/20/2023   PROT 6.9 11/20/2023   ALBUMIN 4.5 11/20/2023   LABGLOB 2.4 11/20/2023   BILITOT 0.4 11/20/2023   ALKPHOS 55 11/20/2023   AST 26 11/20/2023   ALT 15 11/20/2023   Last lipids Lab Results  Component Value Date   CHOL 171 11/20/2023   HDL 92 11/20/2023   LDLCALC 69 11/20/2023   TRIG 47 11/20/2023   CHOLHDL 1.9 11/20/2023   Last hemoglobin A1c Lab Results  Component Value Date   HGBA1C 5.6 11/20/2023   Last thyroid functions Lab Results  Component Value Date   TSH 0.846 11/20/2023   Last vitamin D No results found for: MARIEN BOLLS, VD25OH Last vitamin B12 and Folate Lab Results  Component Value Date   VITAMINB12 541 11/20/2023   FOLATE >20.0  11/20/2023      The 10-year ASCVD risk score (Arnett DK, et al., 2019) is: 17.7%    Assessment & Plan:   Problem List Items Addressed This Visit       Cardiovascular and Mediastinum   Essential hypertension   Blood pressure borderline elevated at 146/82 mmHg. Recent coffee intake may have influenced reading. - Monitor blood pressure at home, avoiding caffeine before measurements. -Recommend low-sodium diet and regular physical activity.  Aim for 150 minutes of cardiac activity a week No medication changes made today.        Musculoskeletal and Integument   Osteoporosis   Relevant Medications   Calcium  Citrate-Vitamin D (CALCIUM  CITRATE + D) 250-5 MG-MCG TABS     Other   Memory changes - Primary   Her acute concern today is changes in memory.  She has previously discussed this with her former PCP.  She will occasionally find herself questioning why she is performing activities and reports briefly putting a pot on the stove and turning it on.  She is able to perform daily activities independently and continues to drive locally near her home.  She does not feel comfortable driving on highways.  Her husband has recently taken over management of household finances.   Recent labs were unrevealing for secondary causes of memory issues, therefore we will proceed with MRI of her brain due to sudden change in cognitive function.      Relevant Orders   MR BRAIN W WO CONTRAST      Return in about 6 months (around 08/04/2024) for chronic follow-up with PCP.    Leita Longs, FNP

## 2024-02-05 NOTE — Telephone Encounter (Signed)
 pt. Rebecca Gilbert just finished seeing laura and forgot to tell her that  she needs her Calcium  Citrate-Vitamin D (CITRACAL + D PO) [[id number]] refilled

## 2024-02-08 NOTE — Assessment & Plan Note (Signed)
 Blood pressure borderline elevated at 146/82 mmHg. Recent coffee intake may have influenced reading. - Monitor blood pressure at home, avoiding caffeine before measurements. -Recommend low-sodium diet and regular physical activity.  Aim for 150 minutes of cardiac activity a week No medication changes made today.

## 2024-02-08 NOTE — Assessment & Plan Note (Signed)
 Her acute concern today is changes in memory.  She has previously discussed this with her former PCP.  She will occasionally find herself questioning why she is performing activities and reports briefly putting a pot on the stove and turning it on.  She is able to perform daily activities independently and continues to drive locally near her home.  She does not feel comfortable driving on highways.  Her husband has recently taken over management of household finances.   Recent labs were unrevealing for secondary causes of memory issues, therefore we will proceed with MRI of her brain due to sudden change in cognitive function.

## 2024-02-10 ENCOUNTER — Ambulatory Visit (HOSPITAL_COMMUNITY)
Admission: RE | Admit: 2024-02-10 | Discharge: 2024-02-10 | Disposition: A | Discharge status: Home / Self Care | Source: Ambulatory Visit

## 2024-02-10 DIAGNOSIS — R413 Other amnesia: Secondary | ICD-10-CM | POA: Diagnosis present

## 2024-02-10 MED ORDER — GADOBUTROL 1 MMOL/ML IV SOLN
7.0000 mL | Freq: Once | INTRAVENOUS | Status: AC | PRN
  Administered 2024-02-10: 7 mL via INTRAVENOUS

## 2024-02-20 ENCOUNTER — Ambulatory Visit: Payer: Self-pay

## 2024-03-26 ENCOUNTER — Telehealth: Payer: Self-pay | Admitting: Neurology

## 2024-03-26 NOTE — Telephone Encounter (Signed)
 spouse states pt will be seen somewhere else

## 2024-04-02 ENCOUNTER — Other Ambulatory Visit: Payer: Self-pay | Admitting: Internal Medicine

## 2024-04-02 DIAGNOSIS — E782 Mixed hyperlipidemia: Secondary | ICD-10-CM

## 2024-04-09 ENCOUNTER — Ambulatory Visit

## 2024-05-06 ENCOUNTER — Other Ambulatory Visit: Payer: Self-pay | Admitting: Internal Medicine

## 2024-05-06 DIAGNOSIS — M810 Age-related osteoporosis without current pathological fracture: Secondary | ICD-10-CM

## 2024-05-14 ENCOUNTER — Other Ambulatory Visit (HOSPITAL_COMMUNITY): Payer: Self-pay | Admitting: Physician Assistant

## 2024-05-14 DIAGNOSIS — E041 Nontoxic single thyroid nodule: Secondary | ICD-10-CM

## 2024-05-23 ENCOUNTER — Ambulatory Visit (HOSPITAL_COMMUNITY)
Admission: RE | Admit: 2024-05-23 | Discharge: 2024-05-23 | Disposition: A | Discharge status: Home / Self Care | Source: Ambulatory Visit | Attending: Physician Assistant | Admitting: Physician Assistant

## 2024-05-23 DIAGNOSIS — E041 Nontoxic single thyroid nodule: Secondary | ICD-10-CM | POA: Insufficient documentation

## 2024-07-22 ENCOUNTER — Ambulatory Visit: Admitting: Neurology
